# Patient Record
Sex: Female | Born: 1935 | Race: White | Hispanic: No | Smoking: Never smoker
Health system: Southern US, Community
[De-identification: ages and names within clinical notes are randomized; demographics above are authoritative.]

## PROBLEM LIST (undated history)

## (undated) DIAGNOSIS — I10 Essential (primary) hypertension: Secondary | ICD-10-CM

## (undated) DIAGNOSIS — K219 Gastro-esophageal reflux disease without esophagitis: Secondary | ICD-10-CM

## (undated) DIAGNOSIS — N289 Disorder of kidney and ureter, unspecified: Secondary | ICD-10-CM

## (undated) DIAGNOSIS — F0390 Unspecified dementia without behavioral disturbance: Secondary | ICD-10-CM

## (undated) HISTORY — PX: TONSILLECTOMY: SUR1361

---

## 2015-10-26 ENCOUNTER — Ambulatory Visit
Admission: EM | Admit: 2015-10-26 | Discharge: 2015-10-26 | Disposition: A | Discharge status: Home / Self Care | Payer: Medicare Other | Attending: Family Medicine | Admitting: Family Medicine

## 2015-10-26 ENCOUNTER — Encounter: Payer: Self-pay | Admitting: Emergency Medicine

## 2015-10-26 DIAGNOSIS — N390 Urinary tract infection, site not specified: Secondary | ICD-10-CM

## 2015-10-26 DIAGNOSIS — R300 Dysuria: Secondary | ICD-10-CM | POA: Diagnosis present

## 2015-10-26 HISTORY — DX: Disorder of kidney and ureter, unspecified: N28.9

## 2015-10-26 LAB — URINALYSIS COMPLETE WITH MICROSCOPIC (ARMC ONLY)
Bilirubin Urine: NEGATIVE
GLUCOSE, UA: NEGATIVE mg/dL
Ketones, ur: 15 mg/dL — AB
Nitrite: POSITIVE — AB
PROTEIN: 30 mg/dL — AB
Specific Gravity, Urine: 1.02 (ref 1.005–1.030)
pH: 5.5 (ref 5.0–8.0)

## 2015-10-26 MED ORDER — NITROFURANTOIN MONOHYD MACRO 100 MG PO CAPS: 100.0000 mg | ORAL_CAPSULE | Freq: Two times a day (BID) | ORAL | 0 refills | Status: DC

## 2015-10-26 NOTE — Discharge Instructions (Signed)
Stay hydrated, rest, seek medical attention if symptoms persist or worsen as discussed.

## 2015-10-26 NOTE — ED Provider Notes (Signed)
[facility]    CSN: [id number] Arrival date & time: 10/26/15  1352  First Provider Contact:  None       History   Chief Complaint Chief Complaint  Patient presents with  . Dysuria    HPI Dana Curtis is a 82 y.o. female.   HPI: Patient has had dysuria for the last 2 days. She denies hematuria, fever, chills, severe abdominal pain. Daughter is with her today. Patient does have a history of UTIs in the past. She has had no vomiting or flank pain. Patient and daughter deny any history of kidney function problems. Daughter denies any mental status changes.  Past Medical History:  Diagnosis Date  . Renal disorder     There are no active problems to display for this patient.   Past Surgical History:  Procedure Laterality Date  . TONSILLECTOMY      OB History    No data available       Home Medications    Prior to Admission medications   Not on File    Family History History reviewed. No pertinent family history.  Social History Social History  Substance Use Topics  . Smoking status: Never Smoker  . Smokeless tobacco: Never Used  . Alcohol use No     Allergies   Review of patient's allergies indicates no known allergies.   Review of Systems Review of Systems: Negative except mentioned above.   Physical Exam Triage Vital Signs ED Triage Vitals  Enc Vitals Group     BP 10/26/15 1516 (!) 143/60     Pulse Rate 10/26/15 1516 73     Resp 10/26/15 1516 17     Temp 10/26/15 1516 97.5 F (36.4 C)     Temp Source 10/26/15 1516 Oral     SpO2 10/26/15 1516 100 %     Weight 10/26/15 1518 135 lb (61.2 kg)     Height 10/26/15 1518 5' (1.524 m)     Head Circumference --      Peak Flow --      Pain Score 10/26/15 1520 4     Pain Loc --      Pain Edu? --      Excl. in GC? --    No data found.   Updated Vital Signs BP (!) 143/60 (BP Location: Left Arm)   Pulse 73   Temp 97.5 F (36.4 C) (Oral)   Resp 17   Ht 5' (1.524 m)   Wt 135 lb  (61.2 kg)   SpO2 100%   BMI 26.37 kg/m      Physical Exam:  GENERAL: NAD RESP: CTA B CARD: RRR ABD: mild suprapubic tenderness, no rebound or guarding, no flank tenderness appreciated   UC Treatments / Results  Labs (all labs ordered are listed, but only abnormal results are displayed) Labs Reviewed  URINE CULTURE  URINALYSIS COMPLETEWITH MICROSCOPIC ([facility] ONLY)    EKG  EKG Interpretation None       Radiology No results found.  Procedures Procedures (including critical care time)  Medications Ordered in UC Medications - No data to display   Initial Impression / Assessment and Plan / UC Course  I have reviewed the triage vital signs and the nursing notes.  Pertinent labs & imaging results that were available during my care of the patient were reviewed by me and considered in my medical decision making (see chart for details).  Clinical Course   A/P: UTI - will treat patient with Macrobid,  will send urine for culture, rest, hydration, seek medical attention if symptoms persist or worsen as discussed. I've asked the daughter to monitor her mother closely while we await the urine culture results if any worsening symptoms to seek medical attention immediately.  Final Clinical Impressions(s) / UC Diagnoses   Final diagnoses:  None    New Prescriptions New Prescriptions   No medications on file     Jolene Provost, MD 10/26/15 1542

## 2015-10-26 NOTE — ED Triage Notes (Signed)
Patient c/o burning when urinating for the past 2 days.  

## 2015-10-29 LAB — URINE CULTURE

## 2016-07-28 ENCOUNTER — Emergency Department: Payer: Medicare Other

## 2016-07-28 ENCOUNTER — Encounter: Payer: Self-pay | Admitting: Emergency Medicine

## 2016-07-28 ENCOUNTER — Emergency Department
Admission: EM | Admit: 2016-07-28 | Discharge: 2016-07-28 | Disposition: A | Discharge status: Skilled Nursing Facility | Payer: Medicare Other | Attending: Emergency Medicine | Admitting: Emergency Medicine

## 2016-07-28 DIAGNOSIS — Z79899 Other long term (current) drug therapy: Secondary | ICD-10-CM | POA: Insufficient documentation

## 2016-07-28 DIAGNOSIS — I10 Essential (primary) hypertension: Secondary | ICD-10-CM | POA: Diagnosis not present

## 2016-07-28 DIAGNOSIS — R112 Nausea with vomiting, unspecified: Secondary | ICD-10-CM

## 2016-07-28 DIAGNOSIS — N390 Urinary tract infection, site not specified: Secondary | ICD-10-CM | POA: Diagnosis not present

## 2016-07-28 DIAGNOSIS — F0390 Unspecified dementia without behavioral disturbance: Secondary | ICD-10-CM | POA: Diagnosis not present

## 2016-07-28 DIAGNOSIS — R1084 Generalized abdominal pain: Secondary | ICD-10-CM | POA: Diagnosis present

## 2016-07-28 DIAGNOSIS — Z7982 Long term (current) use of aspirin: Secondary | ICD-10-CM | POA: Insufficient documentation

## 2016-07-28 HISTORY — DX: Unspecified dementia, unspecified severity, without behavioral disturbance, psychotic disturbance, mood disturbance, and anxiety: F03.90

## 2016-07-28 HISTORY — DX: Gastro-esophageal reflux disease without esophagitis: K21.9

## 2016-07-28 HISTORY — DX: Essential (primary) hypertension: I10

## 2016-07-28 LAB — URINALYSIS, COMPLETE (UACMP) WITH MICROSCOPIC
BILIRUBIN URINE: NEGATIVE
Glucose, UA: NEGATIVE mg/dL
KETONES UR: 5 mg/dL — AB
NITRITE: POSITIVE — AB
PH: 5 (ref 5.0–8.0)
Protein, ur: 100 mg/dL — AB
SPECIFIC GRAVITY, URINE: 1.023 (ref 1.005–1.030)

## 2016-07-28 LAB — COMPREHENSIVE METABOLIC PANEL
ALT: 18 U/L (ref 14–54)
ANION GAP: 11 (ref 5–15)
AST: 36 U/L (ref 15–41)
Albumin: 3.8 g/dL (ref 3.5–5.0)
Alkaline Phosphatase: 69 U/L (ref 38–126)
BILIRUBIN TOTAL: 0.5 mg/dL (ref 0.3–1.2)
BUN: 20 mg/dL (ref 6–20)
CHLORIDE: 101 mmol/L (ref 101–111)
CO2: 23 mmol/L (ref 22–32)
Calcium: 8.9 mg/dL (ref 8.9–10.3)
Creatinine, Ser: 0.62 mg/dL (ref 0.44–1.00)
Glucose, Bld: 171 mg/dL — ABNORMAL HIGH (ref 65–99)
POTASSIUM: 3.9 mmol/L (ref 3.5–5.1)
Sodium: 135 mmol/L (ref 135–145)
TOTAL PROTEIN: 7.4 g/dL (ref 6.5–8.1)

## 2016-07-28 LAB — CBC
HEMATOCRIT: 40.2 % (ref 35.0–47.0)
Hemoglobin: 13.3 g/dL (ref 12.0–16.0)
MCH: 29.2 pg (ref 26.0–34.0)
MCHC: 33.1 g/dL (ref 32.0–36.0)
MCV: 88.4 fL (ref 80.0–100.0)
Platelets: 285 10*3/uL (ref 150–440)
RBC: 4.55 MIL/uL (ref 3.80–5.20)
RDW: 14.7 % — AB (ref 11.5–14.5)
WBC: 17.7 10*3/uL — AB (ref 3.6–11.0)

## 2016-07-28 LAB — TROPONIN I

## 2016-07-28 LAB — LIPASE, BLOOD: LIPASE: 27 U/L (ref 11–51)

## 2016-07-28 MED ORDER — CEPHALEXIN 500 MG PO CAPS: 500.0000 mg | ORAL_CAPSULE | Freq: Two times a day (BID) | ORAL | 0 refills | Status: DC

## 2016-07-28 MED ORDER — IOPAMIDOL (ISOVUE-300) INJECTION 61%
30.0000 mL | Freq: Once | INTRAVENOUS | Status: AC
  Administered 2016-07-28: 30 mL via ORAL

## 2016-07-28 MED ORDER — ONDANSETRON HCL 4 MG/2ML IJ SOLN
4.0000 mg | INTRAMUSCULAR | Status: AC
  Administered 2016-07-28: 4 mg via INTRAVENOUS
  Filled 2016-07-28: qty 2

## 2016-07-28 MED ORDER — IOPAMIDOL (ISOVUE-300) INJECTION 61%
100.0000 mL | Freq: Once | INTRAVENOUS | Status: AC | PRN
  Administered 2016-07-28: 100 mL via INTRAVENOUS

## 2016-07-28 MED ORDER — SODIUM CHLORIDE 0.9 % IV BOLUS (SEPSIS)
500.0000 mL | INTRAVENOUS | Status: AC
  Administered 2016-07-28: 500 mL via INTRAVENOUS

## 2016-07-28 MED ORDER — DEXTROSE 5 % IV SOLN
INTRAVENOUS | Status: AC
  Filled 2016-07-28: qty 10

## 2016-07-28 MED ORDER — DEXTROSE 5 % IV SOLN
1.0000 g | INTRAVENOUS | Status: AC
  Administered 2016-07-28: 1 g via INTRAVENOUS

## 2016-07-28 NOTE — ED Notes (Signed)
ED Provider at bedside. 

## 2016-07-28 NOTE — ED Notes (Signed)
Iv dc'ed from infiltration.

## 2016-07-28 NOTE — ED Notes (Signed)
Pt helped to bedpan and cleaned by this RN

## 2016-07-28 NOTE — ED Provider Notes (Signed)
[facility] Emergency Department Provider Note  ____________________________________________   First MD Initiated Contact with Patient 07/28/16 1545     (approximate)  I have reviewed the triage vital signs and the nursing notes.   HISTORY  Chief Complaint Emesis  Level 5 caveat:  history/ROS limited by chronic dementia  HPI Dana Curtis is a 84 y.o. female with a history of dementia who presents by EMS from her nursing facility for evaluation of abdominal pain with vomiting.  Unfortunately little history is available but reportedly she has vomited multiple times today.  She is in no acute distress at this time and admits to me "my memory is not very good".  She states that nothing is hurting her right at this minute and that she does not feel nauseated.  She is not sure if she was ill earlier.  Reportedly the symptoms were severe and nothing made them better or worse, but this cannot be verified.   Past Medical History:  Diagnosis Date  . Chronic GERD   . Dementia   . Hypertension   . Renal disorder     There are no active problems to display for this patient.   Past Surgical History:  Procedure Laterality Date  . TONSILLECTOMY      Prior to Admission medications   Medication Sig Start Date End Date Taking? Authorizing Provider  amLODipine (NORVASC) 5 MG tablet Take 5 mg by mouth daily.    [provider]  aspirin EC 81 MG tablet Take 81 mg by mouth daily.    [provider]  Calcium 600-400 MG-UNIT CHEW Chew by mouth daily.    [provider]  cephALEXin (KEFLEX) 500 MG capsule Take 1 capsule (500 mg total) by mouth 2 (two) times daily. 07/28/16   Loleta Rose, MD  citalopram (CELEXA) 10 MG tablet Take 10 mg by mouth daily.    [provider]  Cranberry 450 MG TABS Take by mouth daily.    [provider]  donepezil (ARICEPT) 10 MG tablet Take 10 mg by mouth at bedtime.    [provider]    losartan (COZAAR) 100 MG tablet Take 100 mg by mouth daily.    [provider]  Melatonin 3 MG TABS Take by mouth at bedtime.    [provider]  Multiple Vitamin (MULTIVITAMIN) tablet Take 1 tablet by mouth daily.    [provider]  nitrofurantoin, macrocrystal-monohydrate, (MACROBID) 100 MG capsule Take 1 capsule (100 mg total) by mouth 2 (two) times daily. 10/26/15   Jolene Provost, MD  oxybutynin (DITROPAN-XL) 5 MG 24 hr tablet Take 2.5 mg by mouth at bedtime.    [provider]  ranitidine (ZANTAC) 75 MG tablet Take 75 mg by mouth daily.    [provider]    Allergies Patient has no known allergies.  History reviewed. No pertinent family history.  Social History Social History  Substance Use Topics  . Smoking status: Never Smoker  . Smokeless tobacco: Never Used  . Alcohol use No    Review of Systems Level 5 caveat:  history/ROS limited by chronic dementia ____________________________________________   PHYSICAL EXAM:  VITAL SIGNS: ED Triage Vitals  Enc Vitals Group     BP 07/28/16 1517 (!) 158/83     Pulse Rate 07/28/16 1517 (!) 10     Resp 07/28/16 1517 20     Temp 07/28/16 1517 98.7 F (37.1 C)     Temp src --  SpO2 07/28/16 1517 96 %     Weight 07/28/16 1518 68 kg (150 lb)     Height 07/28/16 1518 1.6 m (5\' 3" )     Head Circumference --      Peak Flow --      Pain Score --      Pain Loc --      Pain Edu? --      Excl. in GC? --     Constitutional: Alert And oriented to person.  No acute distress Eyes: Conjunctivae are normal.  Head: Atraumatic. Cardiovascular: Normal rate, regular rhythm. Good peripheral circulation. Grossly normal heart sounds. Respiratory: Normal respiratory effort.  No retractions. Lungs CTAB. Gastrointestinal: Some distention.  Soft but with moderate generalized tenderness throughout that resulted in involuntary guarding and the patient crying out. Musculoskeletal: No lower extremity  tenderness nor edema. No gross deformities of extremities. Neurologic:  Normal speech and language. No gross focal neurologic deficits are appreciated.  Skin:  Skin is warm, dry and intact. No rash noted.   ____________________________________________   LABS (all labs ordered are listed, but only abnormal results are displayed)  Labs Reviewed  COMPREHENSIVE METABOLIC PANEL - Abnormal; Notable for the following:       Result Value   Glucose, Bld 171 (*)    All other components within normal limits  CBC - Abnormal; Notable for the following:    WBC 17.7 (*)    RDW 14.7 (*)    All other components within normal limits  URINALYSIS, COMPLETE (UACMP) WITH MICROSCOPIC - Abnormal; Notable for the following:    Color, Urine YELLOW (*)    APPearance HAZY (*)    Hgb urine dipstick SMALL (*)    Ketones, ur 5 (*)    Protein, ur 100 (*)    Nitrite POSITIVE (*)    Leukocytes, UA TRACE (*)    Bacteria, UA RARE (*)    Squamous Epithelial / LPF 0-5 (*)    All other components within normal limits  URINE CULTURE  LIPASE, BLOOD  TROPONIN I   ____________________________________________  EKG  ED ECG REPORT I, Mark Benecke, the attending physician, personally viewed and interpreted this ECG.  Date: 07/28/2016 EKG Time: 15:19 Rate: 102 Rhythm: borderline sinus tachycardia QRS Axis: normal Intervals:  ST/T Wave abnormalities: Non-specific ST segment / T-wave changes, but no evidence of acute ischemia. Narrative Interpretation: unremarkable   ____________________________________________  RADIOLOGY   Ct Abdomen Pelvis W Contrast  Result Date: 07/28/2016 CLINICAL DATA:  Generalized abdominal pain and distention; concern for obstruction. EXAM: CT ABDOMEN AND PELVIS WITH CONTRAST TECHNIQUE: Multidetector CT imaging of the abdomen and pelvis was performed using the standard protocol following bolus administration of intravenous contrast. CONTRAST:  ISOVUE-300 IOPAMIDOL (ISOVUE-300)  INJECTION 61% COMPARISON:  None. FINDINGS: Lower chest: Cardiomegaly without pericardial effusion. Moderate large hiatal hernia partially imaged at the lung base. There is atelectatic change at each lung base without pneumonic consolidation, effusion or pneumothorax. 8 mm left lower lobe pulmonary nodule image 11/4. Hepatobiliary: 17 mm circumscribed hypodensity in the left hepatic lobe may represent a cyst or hemangioma. No biliary dilatation. No solid enhancing mass lesions. Normal gallbladder without stones. Pancreas: Normal Spleen: No splenomegaly or mass. Adrenals/Urinary Tract: Mild bilateral adrenal gland thickening without nodularity. Small subcentimeter hypodensities too small further characterize within both kidneys statistically consistent with cysts. No obstructive uropathy. Normal appearing bladder. Stomach/Bowel: Hiatal hernia as described before. Normal bowel rotation without obstruction or inflammation. Normal appendix. Moderate stool burden within large bowel  without obstruction or acute inflammation. Scattered diverticulosis without acute diverticulitis. Vascular/Lymphatic: Aortoiliac and branch vessel atherosclerosis without aneurysm or dissection. Patent portal and splenic veins. Reproductive: Fluid-filled endometrial cavity measuring up to 11 mm in thickness possibly representing sequela of cervical stenosis. No uterine mass. There are small rounded hypodensities measuring up to 1 cm that may represent small cysts associated with the right ovary. These findings could be further assessed with nonemergent ultrasound. No left adnexal abnormality Other: No abdominal wall hernia or abnormality. No abdominopelvic ascites. Musculoskeletal: Thoracolumbar spondylosis with lumbar dextroscoliosis. No acute nor suspicious osseous abnormalities. Osteoarthritis of both hip joints with joint space narrowing and subchondral cystic change noted of the left acetabulum. IMPRESSION: 1. Large hiatal hernia. No bowel  obstruction noted. Scattered colonic diverticulosis is seen. 2. Fluid-filled endometrial cavity may reflect cervical stenosis. Small 1 cm or less hypodensities associated with the right ovary likely represent small cysts. These findings could be correlated with ultrasound on a nonemergent basis. 3. Aortoiliac and branch vessel atherosclerosis without aneurysm or dissection. 4. Hypodensities associated with the liver and spleen which may represent small cysts. The renal hypodensities are too small to further characterize. 5. Mild hyperplastic appearance the adrenal glands bilaterally. Electronically Signed   By: Tollie Eth M.D.   On: 07/28/2016 18:07    ____________________________________________   PROCEDURES  Critical Care performed: No   Procedure(s) performed:   Procedures   ____________________________________________   INITIAL IMPRESSION / ASSESSMENT AND PLAN / ED COURSE  Pertinent labs & imaging results that were available during my care of the patient were reviewed by me and considered in my medical decision making (see chart for details).  I am concerned about the possibility of obstruction although given her age and comorbidities, other diagnoses such as mesenteric ischemia, intra-abdominal infection, etc., speak considered.  Her creatinine is normal today and I will provide a small fluid bolus and obtain a CT scan with IV and oral contrast (hoping that she will be able to tolerate some of the oral contrast to identify if there is a transition point).  I also provided Zofran 4 mg IV to help with her nausea.  She is not in need of pain medicine at this time as she is in no pain at rest, only with palpation of her abdomen.   Clinical Course as of Jul 28 2009  Wed Jul 28, 2016  1844 No acute findings on CT, patient is hungry, will try PO challenge CT ABDOMEN PELVIS W CONTRAST [CF]  1917 Patient has not had any vomiting after eating and feels much better.  Her urine is positive  for nitrites and WBCs and I have sent a urine culture and am treating with ceftriaxone 1 g IV.  If she continues to feel fine with no additional vomiting I plan to discharge her back to her facility with antibiotics for UTI.  [CF]  2008 Patient is feeling much better and says she is ready to go to bed.  I will discharge her back to her nursing facility for outpatient follow-up.  [CF]    Clinical Course User Index [CF] Loleta Rose, MD    ____________________________________________  FINAL CLINICAL IMPRESSION(S) / ED DIAGNOSES  Final diagnoses:  Generalized abdominal pain  Non-intractable vomiting with nausea, unspecified vomiting type  Urinary tract infection without hematuria, site unspecified     MEDICATIONS GIVEN DURING THIS VISIT:  Medications  ondansetron (ZOFRAN) injection 4 mg (4 mg Intravenous Given 07/28/16 1601)  sodium chloride 0.9 % bolus 500 mL (  0 mLs Intravenous Stopped 07/28/16 1653)  iopamidol (ISOVUE-300) 61 % injection 30 mL (30 mLs Oral Contrast Given 07/28/16 1621)  iopamidol (ISOVUE-300) 61 % injection 100 mL (100 mLs Intravenous Contrast Given 07/28/16 1736)  cefTRIAXone (ROCEPHIN) 1 g in dextrose 5 % 50 mL IVPB (0 g Intravenous Stopped 07/28/16 1951)     NEW OUTPATIENT MEDICATIONS STARTED DURING THIS VISIT:  New Prescriptions   CEPHALEXIN (KEFLEX) 500 MG CAPSULE    Take 1 capsule (500 mg total) by mouth 2 (two) times daily.    Modified Medications   No medications on file    Discontinued Medications   No medications on file     Note:  This document was prepared using Dragon voice recognition software and may include unintentional dictation errors.    Loleta RoseForbach, Jarick Harkins, MD 07/28/16 2011

## 2016-07-28 NOTE — ED Notes (Signed)
[facility] Said to send pt back by EMS. Med Necessity completed and secretary called for next available transport. Pt informed and going to back to sleep in tx room. No distress noted. Pt ready for transport pending arrival.

## 2016-07-28 NOTE — ED Notes (Signed)
Pt alert.  Pt has abd pain with vomiting today. No diarrhea.  Iv in place.  Labs sent. nsr on monitor.  siderails up x 2.

## 2016-07-28 NOTE — ED Notes (Signed)
Report off to allison rn  

## 2016-07-28 NOTE — ED Triage Notes (Signed)
Pt brought in from [facility].  Pt has vomiting for 1 day.  Hx dementia.  Pt alert  Iv in place

## 2016-07-28 NOTE — ED Notes (Signed)
Patient transported to CT 

## 2016-07-28 NOTE — ED Notes (Signed)
Report from Amy, RN at this time.  

## 2016-07-28 NOTE — Discharge Instructions (Signed)
You have been seen in the Emergency Department (ED) for abdominal pain and vomiting.  Your evaluation did not identify a clear cause of your symptoms but was generally reassuring.  You do have a urinary tract infection for which we prescribed a course of antibiotics.  Please follow up as instructed above regarding today?s emergent visit and the symptoms that are bothering you.  Return to the ED if your abdominal pain worsens or fails to improve, you develop bloody vomiting, bloody diarrhea, you are unable to tolerate fluids due to vomiting, fever greater than 101, or other symptoms that concern you.

## 2016-07-28 NOTE — ED Notes (Signed)
The patient was given a sandwich tray to eat and a cup of ice water. She was sat upright and thanked me for it several times.

## 2016-07-28 NOTE — ED Notes (Signed)
Pt helped onto bedpan and cleaned by this RN

## 2016-07-28 NOTE — ED Notes (Signed)
Pt. Facility staff Verbalizes understanding of d/c instructions, prescriptions, and follow-up. VS stable and pain controlled per pt.  Pt. In NAD at time of d/c and denies further concerns regarding this visit. Pt. Stable at the time of departure from the unit, departing unit by the safest and most appropriate manner per that pt condition and limitations. Pt facility staff advised to return to the ED at any time for emergent concerns, or for new/worsening symptoms.

## 2016-07-31 LAB — URINE CULTURE
Culture: >=100000 — AB
Special Requests: NORMAL

## 2017-01-28 ENCOUNTER — Encounter: Payer: Self-pay | Admitting: Emergency Medicine

## 2017-01-28 ENCOUNTER — Emergency Department: Payer: Medicare Other

## 2017-01-28 ENCOUNTER — Emergency Department
Admission: EM | Admit: 2017-01-28 | Discharge: 2017-01-28 | Disposition: A | Discharge status: Home / Self Care | Payer: Medicare Other | Attending: Emergency Medicine | Admitting: Emergency Medicine

## 2017-01-28 ENCOUNTER — Other Ambulatory Visit: Payer: Self-pay

## 2017-01-28 DIAGNOSIS — M79601 Pain in right arm: Secondary | ICD-10-CM | POA: Diagnosis not present

## 2017-01-28 DIAGNOSIS — Y999 Unspecified external cause status: Secondary | ICD-10-CM | POA: Insufficient documentation

## 2017-01-28 DIAGNOSIS — S0990XA Unspecified injury of head, initial encounter: Secondary | ICD-10-CM | POA: Diagnosis not present

## 2017-01-28 DIAGNOSIS — Y939 Activity, unspecified: Secondary | ICD-10-CM | POA: Insufficient documentation

## 2017-01-28 DIAGNOSIS — F0390 Unspecified dementia without behavioral disturbance: Secondary | ICD-10-CM | POA: Insufficient documentation

## 2017-01-28 DIAGNOSIS — Y92122 Bedroom in nursing home as the place of occurrence of the external cause: Secondary | ICD-10-CM | POA: Diagnosis not present

## 2017-01-28 DIAGNOSIS — M25551 Pain in right hip: Secondary | ICD-10-CM | POA: Insufficient documentation

## 2017-01-28 DIAGNOSIS — M25561 Pain in right knee: Secondary | ICD-10-CM | POA: Insufficient documentation

## 2017-01-28 DIAGNOSIS — W06XXXA Fall from bed, initial encounter: Secondary | ICD-10-CM | POA: Insufficient documentation

## 2017-01-28 DIAGNOSIS — I10 Essential (primary) hypertension: Secondary | ICD-10-CM | POA: Diagnosis not present

## 2017-01-28 DIAGNOSIS — W19XXXA Unspecified fall, initial encounter: Secondary | ICD-10-CM

## 2017-01-28 IMAGING — CR DG FEMUR 2+V*R*
4 series · 4 of 4 positions shown · non-contrast
Comparison: None.

CLINICAL DATA: Pain following fall

EXAM:
RIGHT FEMUR 2 VIEWS

[femur ap (1 of 2)]
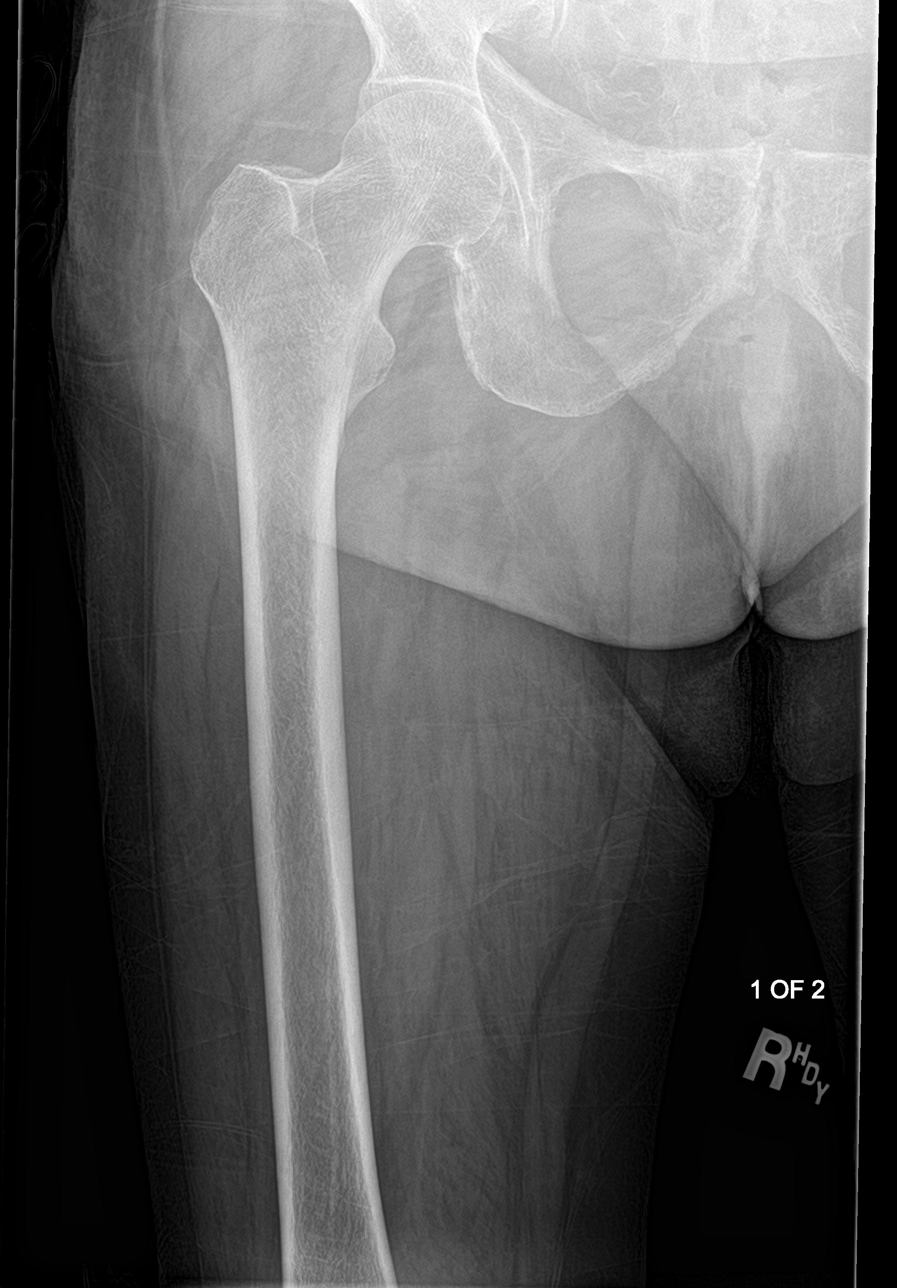

[femur ap (2 of 2)]
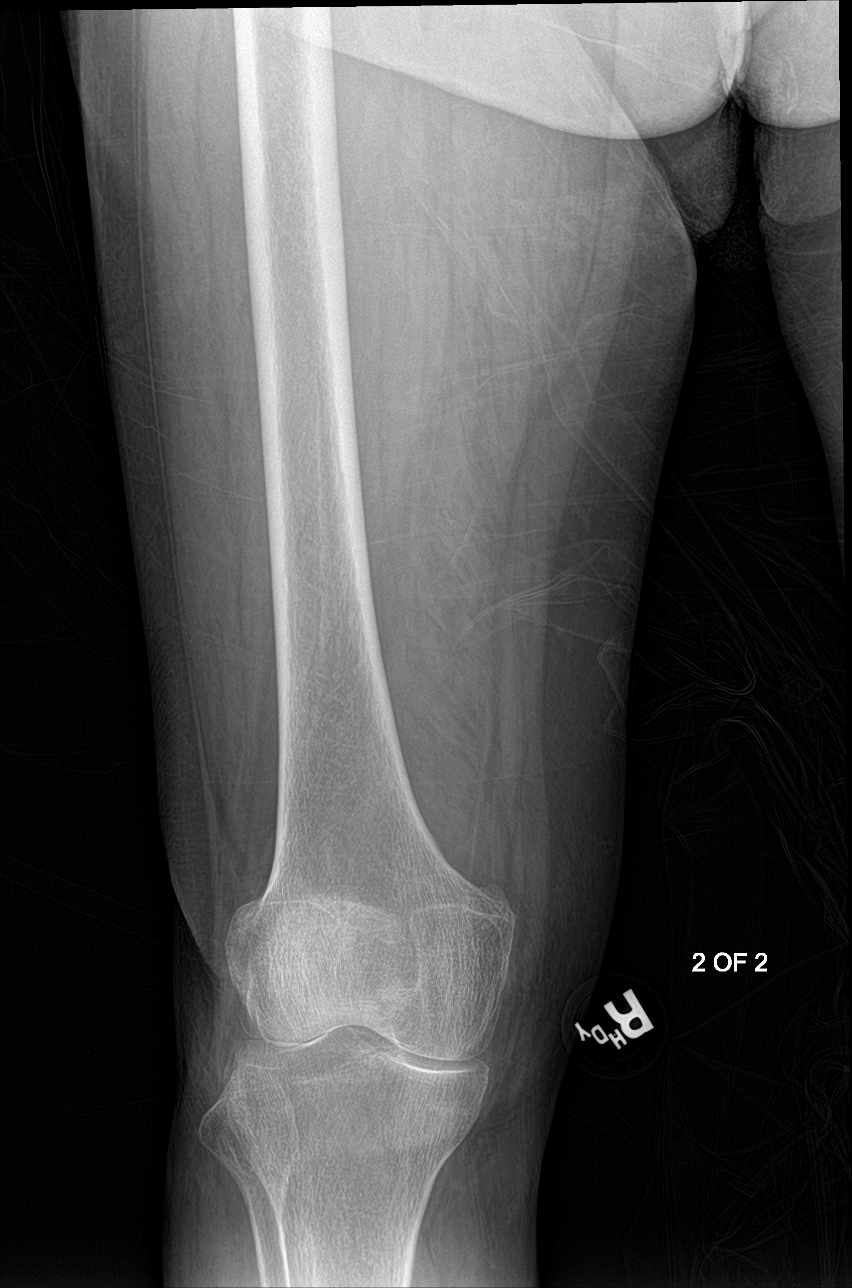

[femur lat (1 of 2)]
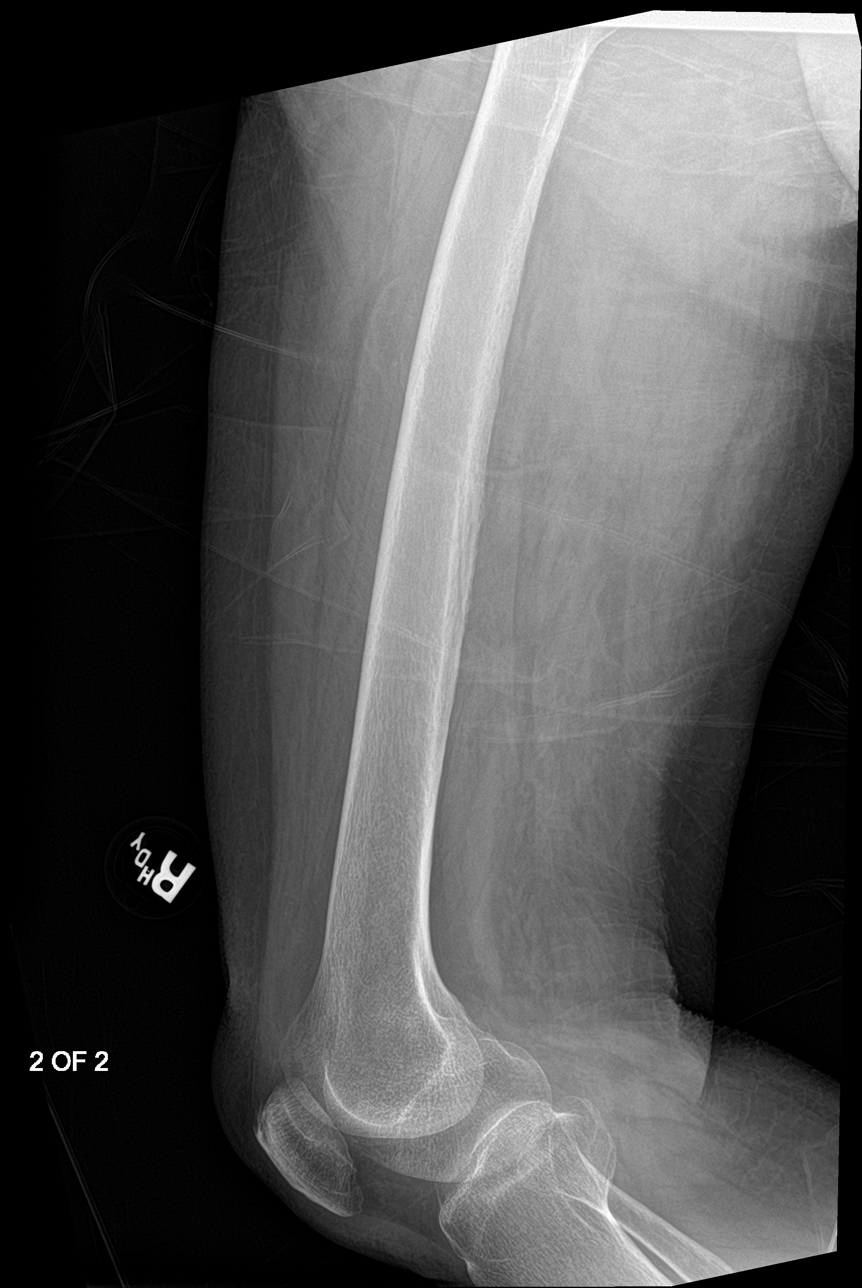

[femur lat (2 of 2)]
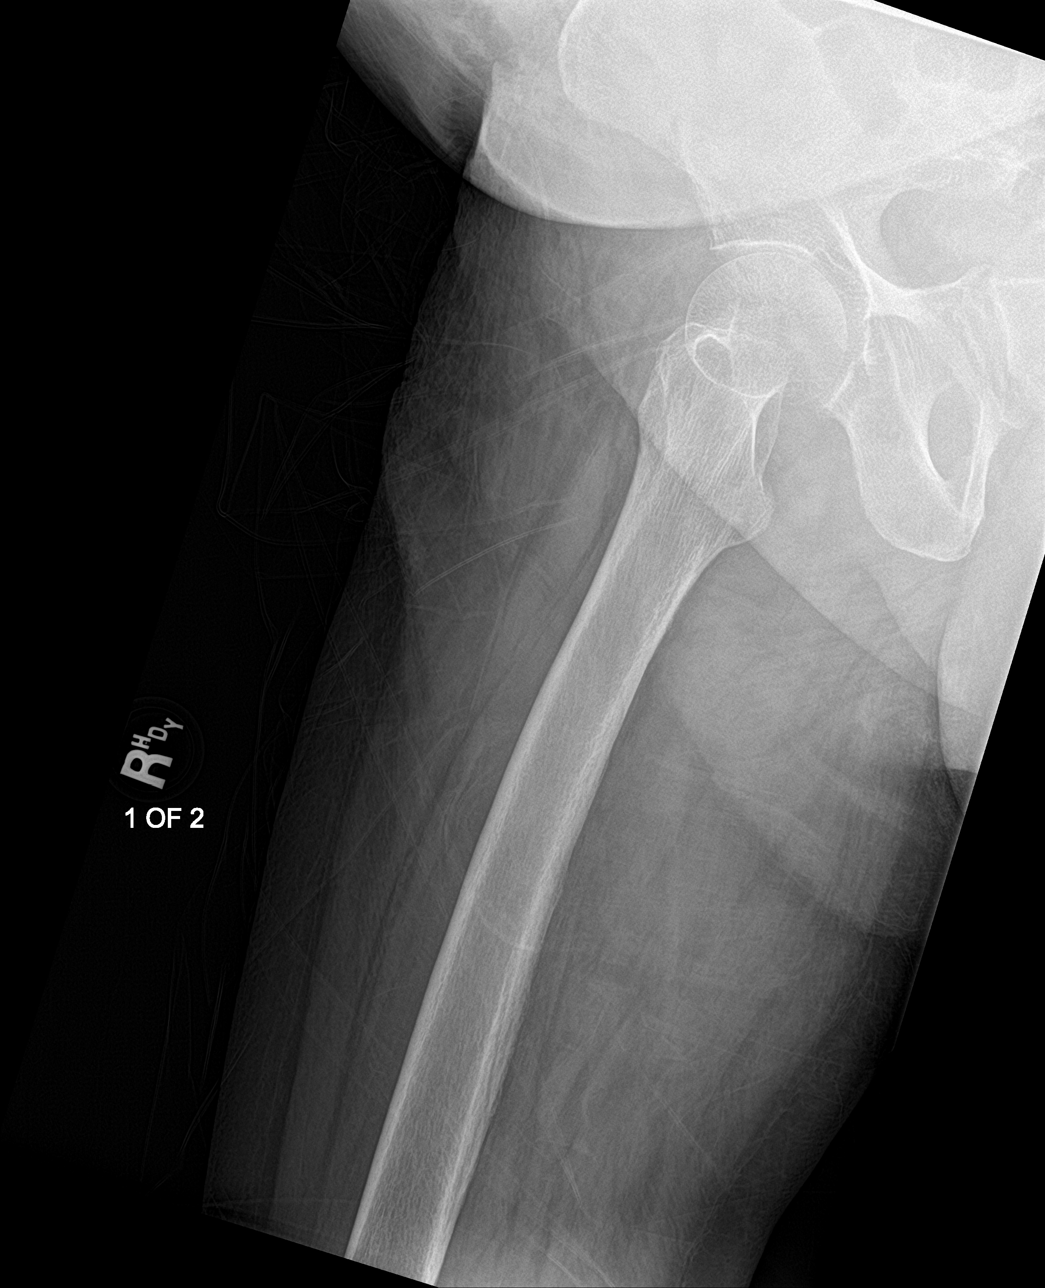

[4 of 4 positions shown; findings below may reference images not displayed]

FINDINGS: Frontal and lateral views were obtained. There is no evident
fracture or dislocation. Bones are osteoporotic. There is mild
narrowing of the right hip joint. No knee joint effusion.
IMPRESSION: Bones osteoporotic.  No fracture or dislocation.

## 2017-01-28 NOTE — ED Provider Notes (Signed)
[facility] Emergency Department Provider Note       Time seen: ----------------------------------------- 10:25 AM on 01/28/2017 ----------------------------------------- Level V caveat: History/ROS limited by dementia  I have reviewed the triage vital signs and the nursing notes.  HISTORY   Chief Complaint Fall; Shoulder Pain; and Knee Pain   HPI Fritzi MandesJoyce Zartman is a 83 y.o. female with a history of dementia, hypertension and renal disorder who presents to the ED for a fall that was unwitnessed.  [facility] out of bed.  It was reported she rolled out of bed and hit her head on the wall.  She is complaining of right hip and right arm pain as well as right knee pain.  She is DNR.  Is sent to the ER for further evaluation.  Review of systems and history is limited by dementia.  Past Medical History:  Diagnosis Date  . Chronic GERD   . Dementia   . Hypertension   . Renal disorder     There are no active problems to display for this patient.   Past Surgical History:  Procedure Laterality Date  . TONSILLECTOMY      Allergies Patient has no known allergies.  Social History Social History   Tobacco Use  . Smoking status: Never Smoker  . Smokeless tobacco: Never Used  Substance Use Topics  . Alcohol use: No  . Drug use: No    Review of Systems Constitutional: Negative for fever. Cardiovascular: Negative for chest pain. Respiratory: Negative for shortness of breath. Gastrointestinal: Negative for abdominal pain, vomiting and diarrhea. Musculoskeletal: Positive for right hip, right knee, right elbow pain Skin: Negative for rash. Neurological: Negative for headaches  All systems negative/normal/unremarkable except as stated in the HPI  ____________________________________________   PHYSICAL EXAM:  VITAL SIGNS: ED Triage Vitals  Enc Vitals Group     BP      Pulse      Resp      Temp       Temp src      SpO2      Weight      Height      Head Circumference      Peak Flow      Pain Score      Pain Loc      Pain Edu?      Excl. in GC?     Constitutional: Alert but disoriented. Well appearing and in no distress. Eyes: Conjunctivae are normal. Normal extraocular movements. ENT   Head: Normocephalic and atraumatic.   Nose: No congestion/rhinnorhea.   Mouth/Throat: Mucous membranes are moist.   Neck: No stridor. Cardiovascular: Normal rate, regular rhythm. No murmurs, rubs, or gallops. Respiratory: Normal respiratory effort without tachypnea nor retractions. Breath sounds are clear and equal bilaterally. No wheezes/rales/rhonchi. Gastrointestinal: Soft and nontender. Normal bowel sounds Musculoskeletal: Pain with range of motion of the right arm, right leg including the knee and hip Neurologic:  Normal speech and language. No gross focal neurologic deficits are appreciated.  Skin:  Skin is warm, dry and intact. No rash noted. Psychiatric: Mood and affect are normal. Speech and behavior are normal.  ___________________________________________  ED COURSE:  Pertinent labs & imaging results that were available during my care of the patient were reviewed by me and considered in my medical decision making (see chart for details). Patient presents for a fall, we will assess with imaging as indicated.   Procedures ____________________________________________   RADIOLOGY Images  were viewed by me  CT head, right humerus x-rays, right femur x-rays, pelvis x-ray  IMPRESSION: Diffuse atrophy with slight supratentorial small vessel disease. No mass, hemorrhage, or extra-axial fluid collection. No acute infarct evident.  There are foci of arterial vascular calcification. There is ethmoid sinus disease as well as right mastoid disease. IMPRESSION: Symmetric narrowing of both hip joints. Arthropathy in the pubic symphysis. No acute fracture or  dislocation. IMPRESSION: No fracture or dislocation. Bones osteoporotic. Mild degenerative change in the right shoulder.  IMPRESSION: Bones osteoporotic. No fracture or dislocation. ____________________________________________  DIFFERENTIAL DIAGNOSIS   Closed head injury, subdural hematoma, fracture, dislocation, contusion  FINAL ASSESSMENT AND PLAN  Fall, minor head injury   Plan: Patient had presented for mechanical fall.  Patient's imaging here was reassuring.  We were able to ambulate her with out any acute complaints.  She is stable to return to the nursing home.   Emily FilbertWilliams, Liat Mayol E, MD   Note: This note was generated in part or whole with voice recognition software. Voice recognition is usually quite accurate but there are transcription errors that can and very often do occur. I apologize for any typographical errors that were not detected and corrected.     Emily FilbertWilliams, Andru Genter E, MD 01/28/17 [phone]

## 2017-01-28 NOTE — ED Notes (Signed)
Attempted x3 to [facility] no answer each time called and no voicemail. Paperwork including DNR given to daughter Burnett HarryShelly.  Pt left with hearing aids in place bilaterally.

## 2017-01-28 NOTE — ED Notes (Signed)
Pt ambulated in the room with walker, assisted patient to the bathroom as well. Pt c/o pain, but states she was able to tolerate it.  [facility]. Pt uses a walker at home.

## 2017-01-28 NOTE — ED Triage Notes (Signed)
[facility] s/p fall out of bed.  It was reported the patient rolled out of bed and hit her head on the wall.  Pt c/o right hip pain and shoulder pain, also c/o knee pain on the right to light palpation.  Pt has DNR sheet with her.

## 2017-01-28 NOTE — ED Notes (Signed)
Pt transported to CT/XR 

## 2018-03-02 ENCOUNTER — Emergency Department
Admission: EM | Admit: 2018-03-02 | Discharge: 2018-03-02 | Disposition: A | Discharge status: Home / Self Care | Payer: Medicare Other | Attending: Emergency Medicine | Admitting: Emergency Medicine

## 2018-03-02 ENCOUNTER — Emergency Department: Payer: Medicare Other

## 2018-03-02 ENCOUNTER — Encounter: Payer: Self-pay | Admitting: Medical Oncology

## 2018-03-02 DIAGNOSIS — Y999 Unspecified external cause status: Secondary | ICD-10-CM | POA: Insufficient documentation

## 2018-03-02 DIAGNOSIS — Y939 Activity, unspecified: Secondary | ICD-10-CM | POA: Insufficient documentation

## 2018-03-02 DIAGNOSIS — F0390 Unspecified dementia without behavioral disturbance: Secondary | ICD-10-CM | POA: Diagnosis not present

## 2018-03-02 DIAGNOSIS — I10 Essential (primary) hypertension: Secondary | ICD-10-CM | POA: Insufficient documentation

## 2018-03-02 DIAGNOSIS — W06XXXA Fall from bed, initial encounter: Secondary | ICD-10-CM | POA: Insufficient documentation

## 2018-03-02 DIAGNOSIS — S46911A Strain of unspecified muscle, fascia and tendon at shoulder and upper arm level, right arm, initial encounter: Secondary | ICD-10-CM | POA: Diagnosis not present

## 2018-03-02 DIAGNOSIS — Y92002 Bathroom of unspecified non-institutional (private) residence single-family (private) house as the place of occurrence of the external cause: Secondary | ICD-10-CM | POA: Diagnosis not present

## 2018-03-02 DIAGNOSIS — M25511 Pain in right shoulder: Secondary | ICD-10-CM

## 2018-03-02 DIAGNOSIS — W19XXXA Unspecified fall, initial encounter: Secondary | ICD-10-CM

## 2018-03-02 DIAGNOSIS — M25551 Pain in right hip: Secondary | ICD-10-CM

## 2018-03-02 DIAGNOSIS — S4991XA Unspecified injury of right shoulder and upper arm, initial encounter: Secondary | ICD-10-CM | POA: Diagnosis present

## 2018-03-02 LAB — TROPONIN I: Troponin I: <0.03 ng/mL (ref <0.03)

## 2018-03-02 LAB — COMPREHENSIVE METABOLIC PANEL
ALK PHOS: 74 U/L (ref 38–126)
ALT: 14 U/L (ref 0–44)
AST: 18 U/L (ref 15–41)
Albumin: 3.8 g/dL (ref 3.5–5.0)
Anion gap: 11 (ref 5–15)
BUN: 19 mg/dL (ref 8–23)
CALCIUM: 9.4 mg/dL (ref 8.9–10.3)
CHLORIDE: 104 mmol/L (ref 98–111)
CO2: 24 mmol/L (ref 22–32)
CREATININE: 0.75 mg/dL (ref 0.44–1.00)
GFR calc non Af Amer: >60 mL/min (ref >60)
Glucose, Bld: 122 mg/dL — ABNORMAL HIGH (ref 70–99)
Potassium: 3.8 mmol/L (ref 3.5–5.1)
SODIUM: 139 mmol/L (ref 135–145)
Total Bilirubin: 0.5 mg/dL (ref 0.3–1.2)
Total Protein: 7.5 g/dL (ref 6.5–8.1)

## 2018-03-02 LAB — URINALYSIS, COMPLETE (UACMP) WITH MICROSCOPIC
BILIRUBIN URINE: NEGATIVE
GLUCOSE, UA: NEGATIVE mg/dL
HGB URINE DIPSTICK: NEGATIVE
Ketones, ur: NEGATIVE mg/dL
Nitrite: POSITIVE — AB
PH: 8 (ref 5.0–8.0)
PROTEIN: NEGATIVE mg/dL
SPECIFIC GRAVITY, URINE: 1.005 (ref 1.005–1.030)

## 2018-03-02 LAB — CBC WITH DIFFERENTIAL/PLATELET
ABS IMMATURE GRANULOCYTES: 0.05 10*3/uL (ref 0.00–0.07)
Basophils Absolute: 0.1 10*3/uL (ref 0.0–0.1)
Basophils Relative: 0 %
Eosinophils Absolute: 0.2 10*3/uL (ref 0.0–0.5)
Eosinophils Relative: 2 %
HCT: 39.7 % (ref 36.0–46.0)
HEMOGLOBIN: 12.9 g/dL (ref 12.0–15.0)
IMMATURE GRANULOCYTES: 0 %
LYMPHS PCT: 22 %
Lymphs Abs: 2.6 10*3/uL (ref 0.7–4.0)
MCH: 29.9 pg (ref 26.0–34.0)
MCHC: 32.5 g/dL (ref 30.0–36.0)
MCV: 91.9 fL (ref 80.0–100.0)
MONO ABS: 0.9 10*3/uL (ref 0.1–1.0)
MONOS PCT: 8 %
NEUTROS ABS: 8.1 10*3/uL — AB (ref 1.7–7.7)
NEUTROS PCT: 68 %
Platelets: 385 10*3/uL (ref 150–400)
RBC: 4.32 MIL/uL (ref 3.87–5.11)
RDW: 14.1 % (ref 11.5–15.5)
WBC: 12 10*3/uL — ABNORMAL HIGH (ref 4.0–10.5)
nRBC: 0 % (ref 0.0–0.2)

## 2018-03-02 NOTE — ED Notes (Signed)
Pt agitated and stating that she is wet - pt checked and dry - family at bedside

## 2018-03-02 NOTE — ED Provider Notes (Signed)
[facility] Emergency Department Provider Note       Time seen: ----------------------------------------- 10:00 AM on 03/02/2018 -----------------------------------------   I have reviewed the triage vital signs and the nursing notes.  HISTORY   Chief Complaint Fall and Hip Pain    HPI Dana Curtis is a 85 y.o. female with a history of GERD, dementia, hypertension, renal disorder who presents to the ED for mechanical fall.  Patient rolled out of bed about 3 feet and landed on her right side.  She was complaining of right hip and right shoulder pain.  She is severely hard of hearing.  Denies any other injuries or complaints.  Past Medical History:  Diagnosis Date  . Chronic GERD   . Dementia (HCC)   . Hypertension   . Renal disorder     There are no active problems to display for this patient.   Past Surgical History:  Procedure Laterality Date  . TONSILLECTOMY      Allergies Lisinopril and Macrobid [nitrofurantoin]  Social History Social History   Tobacco Use  . Smoking status: Never Smoker  . Smokeless tobacco: Never Used  Substance Use Topics  . Alcohol use: No  . Drug use: No   Review of Systems Constitutional: Negative for fever. Cardiovascular: Negative for chest pain. Respiratory: Negative for shortness of breath. Gastrointestinal: Negative for abdominal pain, vomiting and diarrhea. Musculoskeletal: Positive for right hip pain, right shoulder pain Skin: Negative for rash. Neurological: Negative for headaches, focal weakness or numbness.  All systems negative/normal/unremarkable except as stated in the HPI  ____________________________________________   PHYSICAL EXAM:  VITAL SIGNS: ED Triage Vitals  Enc Vitals Group     BP 03/02/18 0953 (!) 163/117     Pulse Rate 03/02/18 0953 62     Resp --      Temp 03/02/18 0953 (!) 97.5 F (36.4 C)     Temp Source 03/02/18 0953 Oral     SpO2 03/02/18 0953 99 %     Weight  03/02/18 0955 158 lb 11.7 oz (72 kg)     Height --      Head Circumference --      Peak Flow --      Pain Score 03/02/18 0955 8     Pain Loc --      Pain Edu? --      Excl. in GC? --    Constitutional: Alert, Well appearing and in no distress. Eyes: Conjunctivae are normal. Normal extraocular movements. ENT      Head: Normocephalic and atraumatic.      Nose: No congestion/rhinnorhea.      Mouth/Throat: Mucous membranes are moist.      Neck: No stridor. Cardiovascular: Normal rate, regular rhythm. No murmurs, rubs, or gallops. Respiratory: Normal respiratory effort without tachypnea nor retractions. Breath sounds are clear and equal bilaterally. No wheezes/rales/rhonchi. Gastrointestinal: Soft and nontender. Normal bowel sounds Musculoskeletal: Severe pain with range of motion of the right shoulder and right hip. Neurologic:  Normal speech and language. No gross focal neurologic deficits are appreciated.  Skin:  Skin is warm, dry and intact. No rash noted. Psychiatric: Mood and affect are normal. Speech and behavior are normal.  ____________________________________________  ED COURSE:  As part of my medical decision making, I reviewed the following data within the electronic MEDICAL RECORD NUMBER History obtained from family if available, nursing notes, old chart and ekg, as well as notes from prior ED visits. Patient presented for mechanical fall, we will assess with  labs and imaging as indicated at this time.   Procedures ____________________________________________   LABS (pertinent positives/negatives)  Labs Reviewed  CBC WITH DIFFERENTIAL/PLATELET - Abnormal; Notable for the following components:      Result Value   WBC 12.0 (*)    Neutro Abs 8.1 (*)    All other components within normal limits  COMPREHENSIVE METABOLIC PANEL - Abnormal; Notable for the following components:   Glucose, Bld 122 (*)    All other components within normal limits  URINALYSIS, COMPLETE (UACMP)  WITH MICROSCOPIC - Abnormal; Notable for the following components:   Color, Urine YELLOW (*)    APPearance CLEAR (*)    Nitrite POSITIVE (*)    Leukocytes, UA TRACE (*)    Bacteria, UA MANY (*)    All other components within normal limits  TROPONIN I  CBG MONITORING, ED    RADIOLOGY Images were viewed by me  CT head, right shoulder, right hip x-rays IMPRESSION: Similar findings of advanced atrophy and mild microvascular ischemic disease without superimposed acute intracranial process on this motion degraded examination. IMPRESSION: Acromioclavicular glenohumeral degenerative change. No acute bony Abnormality. IMPRESSION: 1. No acute findings. 2. Mild degenerative change of the right hip. ____________________________________________   DIFFERENTIAL DIAGNOSIS   Contusion, fracture, dehydration, electrolyte abnormality  FINAL ASSESSMENT AND PLAN  Fall, hip contusion, shoulder strain   Plan: The patient had presented for mechanical fall. Patient's labs did not reveal any acute process. Patient's imaging was reassuring.  She is cleared for outpatient follow-up.   Ulice Dash, MD    Note: This note was generated in part or whole with voice recognition software. Voice recognition is usually quite accurate but there are transcription errors that can and very often do occur. I apologize for any typographical errors that were not detected and corrected.     Emily Filbert, MD 03/02/18 1310

## 2018-03-02 NOTE — ED Notes (Signed)
PT ambulates with two person assist - daughter states that this is pt normal

## 2018-03-02 NOTE — ED Triage Notes (Signed)
Pt from [facility] via ems with reports that pt rolled out of bed about 3 ft high and landed on her rt side. Pt c/o rt hip, rt arm, shoulder pain. Pt is HOH.

## 2018-03-02 NOTE — ED Notes (Signed)
Patient transported to X-ray 

## 2018-03-02 NOTE — ED Notes (Signed)
Assisted pt to wheelchair and to car. Daughter with patient, driving pt home

## 2018-03-02 NOTE — ED Notes (Signed)
Pt voided in pull up - pt changed and purewick placed

## 2018-03-24 ENCOUNTER — Ambulatory Visit
Admission: EM | Admit: 2018-03-24 | Discharge: 2018-03-24 | Disposition: A | Discharge status: Home / Self Care | Payer: Medicare Other | Attending: Family Medicine | Admitting: Family Medicine

## 2018-03-24 DIAGNOSIS — N390 Urinary tract infection, site not specified: Secondary | ICD-10-CM | POA: Insufficient documentation

## 2018-03-24 LAB — URINALYSIS, COMPLETE (UACMP) WITH MICROSCOPIC
Glucose, UA: NEGATIVE mg/dL
Hgb urine dipstick: NEGATIVE
Nitrite: POSITIVE — AB
PROTEIN: 30 mg/dL — AB
RBC / HPF: NONE SEEN RBC/hpf (ref 0–5)
SPECIFIC GRAVITY, URINE: 1.025 (ref 1.005–1.030)
pH: 5 (ref 5.0–8.0)

## 2018-03-24 MED ORDER — CEPHALEXIN 500 MG PO CAPS: 500.0000 mg | ORAL_CAPSULE | Freq: Two times a day (BID) | ORAL | 0 refills | Status: DC

## 2018-03-24 NOTE — ED Triage Notes (Signed)
Pt here for UTI and her only symptom is she agitated.

## 2018-03-24 NOTE — ED Provider Notes (Signed)
[facility]    CSN: [id number] Arrival date & time: 03/24/18  1444     History   Chief Complaint Chief Complaint  Patient presents with  . Urinary Tract Infection    HPI Dana Curtis is a 85 y.o. female.   85 yo female brought in by daughter's from the group home where patient resides, with a c/o "being snippy" (ie not herself; behavior change). Patient denies any fevers, chills, vomiting, dysuria, abdominal pain.   The history is provided by the patient and a relative.  Urinary Tract Infection    Past Medical History:  Diagnosis Date  . Chronic GERD   . Dementia (HCC)   . Hypertension   . Renal disorder     There are no active problems to display for this patient.   Past Surgical History:  Procedure Laterality Date  . TONSILLECTOMY      OB History   No obstetric history on file.      Home Medications    Prior to Admission medications   Medication Sig Start Date End Date Taking? Authorizing Provider  amLODipine (NORVASC) 5 MG tablet Take 5 mg by mouth daily.   Yes [provider]  aspirin EC 81 MG tablet Take 81 mg by mouth daily.   Yes [provider]  Calcium 600-400 MG-UNIT CHEW Chew by mouth daily.   Yes [provider]  citalopram (CELEXA) 10 MG tablet Take 10 mg by mouth daily.   Yes [provider]  donepezil (ARICEPT) 10 MG tablet Take 10 mg by mouth at bedtime.   Yes [provider]  losartan (COZAAR) 100 MG tablet Take 100 mg by mouth daily.   Yes [provider]  Melatonin 3 MG TABS Take by mouth at bedtime.   Yes [provider]  Multiple Vitamin (MULTIVITAMIN) tablet Take 1 tablet by mouth daily.   Yes [provider]  oxybutynin (DITROPAN-XL) 5 MG 24 hr tablet Take 2.5 mg by mouth at bedtime.   Yes [provider]  ranitidine (ZANTAC) 75 MG tablet Take 75 mg by mouth daily.   Yes [provider]  cephALEXin (KEFLEX) 500 MG capsule Take 1  capsule (500 mg total) by mouth 2 (two) times daily. 03/24/18   Payton Mccallum, MD  Cranberry 450 MG TABS Take by mouth daily.    [provider]  nitrofurantoin, macrocrystal-monohydrate, (MACROBID) 100 MG capsule Take 1 capsule (100 mg total) by mouth 2 (two) times daily. 10/26/15   Jolene Provost, MD    Family History History reviewed. No pertinent family history.  Social History Social History   Tobacco Use  . Smoking status: Never Smoker  . Smokeless tobacco: Never Used  Substance Use Topics  . Alcohol use: No  . Drug use: No     Allergies   Lisinopril and Macrobid [nitrofurantoin]   Review of Systems Review of Systems   Physical Exam Triage Vital Signs ED Triage Vitals [03/24/18 1620]  Enc Vitals Group     BP (!) 131/53     Pulse Rate 72     Resp 18     Temp 97.9 F (36.6 C)     Temp Source Oral     SpO2 98 %     Weight 148 lb (67.1 kg)     Height 4\' 8"  (1.422 m)     Head Circumference      Peak Flow      Pain Score 0     Pain  Loc      Pain Edu?      Excl. in GC?    No data found.  Updated Vital Signs BP (!) 131/53 (BP Location: Left Arm)   Pulse 72   Temp 97.9 F (36.6 C) (Oral)   Resp 18   Ht 4\' 8"  (1.422 m)   Wt 67.1 kg   SpO2 98%   BMI 33.18 kg/m   Visual Acuity Right Eye Distance:   Left Eye Distance:   Bilateral Distance:    Right Eye Near:   Left Eye Near:    Bilateral Near:     Physical Exam Vitals signs and nursing note reviewed.  Constitutional:      General: She is not in acute distress.    Appearance: She is not toxic-appearing or diaphoretic.  Abdominal:     General: There is no distension.     Palpations: Abdomen is soft.     Tenderness: There is no abdominal tenderness. There is no right CVA tenderness, left CVA tenderness, guarding or rebound.  Neurological:     Mental Status: She is alert.      UC Treatments / Results  Labs (all labs ordered are listed, but only abnormal results are displayed) Labs  Reviewed  URINALYSIS, COMPLETE (UACMP) WITH MICROSCOPIC - Abnormal; Notable for the following components:      Result Value   APPearance CLOUDY (*)    Bilirubin Urine SMALL (*)    Ketones, ur TRACE (*)    Protein, ur 30 (*)    Nitrite POSITIVE (*)    Leukocytes, UA TRACE (*)    Bacteria, UA MANY (*)    All other components within normal limits    EKG None  Radiology No results found.  Procedures Procedures (including critical care time)  Medications Ordered in UC Medications - No data to display  Initial Impression / Assessment and Plan / UC Course  I have reviewed the triage vital signs and the nursing notes.  Pertinent labs & imaging results that were available during my care of the patient were reviewed by me and considered in my medical decision making (see chart for details).      Final Clinical Impressions(s) / UC Diagnoses   Final diagnoses:  Urinary tract infection without hematuria, site unspecified    ED Prescriptions    Medication Sig Dispense Auth. Provider   cephALEXin (KEFLEX) 500 MG capsule Take 1 capsule (500 mg total) by mouth 2 (two) times daily. 14 capsule Payton Mccallum, MD      1. Labs/x-ray results and diagnosis reviewed with patient and family 2. rx as per orders above; reviewed possible side effects, interactions, risks and benefits  3. Recommend supportive treatment with increased water intake 4. Follow-up prn if symptoms worsen or don't improve  Controlled Substance Prescriptions [place] Controlled Substance Registry consulted? Not Applicable   Payton Mccallum, MD 03/24/18 [phone]

## 2018-04-06 ENCOUNTER — Telehealth: Payer: Self-pay | Admitting: *Deleted

## 2018-04-06 NOTE — Telephone Encounter (Signed)
error 

## 2019-05-10 ENCOUNTER — Emergency Department: Payer: Medicare Other

## 2019-05-10 ENCOUNTER — Emergency Department
Admission: EM | Admit: 2019-05-10 | Discharge: 2019-05-11 | Disposition: A | Discharge status: Home / Self Care | Payer: Medicare Other | Attending: Emergency Medicine | Admitting: Emergency Medicine

## 2019-05-10 ENCOUNTER — Other Ambulatory Visit: Payer: Self-pay

## 2019-05-10 DIAGNOSIS — N390 Urinary tract infection, site not specified: Secondary | ICD-10-CM | POA: Insufficient documentation

## 2019-05-10 DIAGNOSIS — R4182 Altered mental status, unspecified: Secondary | ICD-10-CM | POA: Diagnosis present

## 2019-05-10 DIAGNOSIS — Z79899 Other long term (current) drug therapy: Secondary | ICD-10-CM | POA: Diagnosis not present

## 2019-05-10 DIAGNOSIS — I10 Essential (primary) hypertension: Secondary | ICD-10-CM | POA: Insufficient documentation

## 2019-05-10 DIAGNOSIS — R451 Restlessness and agitation: Secondary | ICD-10-CM | POA: Insufficient documentation

## 2019-05-10 DIAGNOSIS — F0390 Unspecified dementia without behavioral disturbance: Secondary | ICD-10-CM | POA: Diagnosis not present

## 2019-05-10 DIAGNOSIS — R1032 Left lower quadrant pain: Secondary | ICD-10-CM | POA: Diagnosis not present

## 2019-05-10 LAB — COMPREHENSIVE METABOLIC PANEL
ALT: 11 U/L (ref 0–44)
AST: 21 U/L (ref 15–41)
Albumin: 3.2 g/dL — ABNORMAL LOW (ref 3.5–5.0)
Alkaline Phosphatase: 59 U/L (ref 38–126)
Anion gap: 10 (ref 5–15)
BUN: 19 mg/dL (ref 8–23)
CO2: 25 mmol/L (ref 22–32)
Calcium: 8.3 mg/dL — ABNORMAL LOW (ref 8.9–10.3)
Chloride: 106 mmol/L (ref 98–111)
Creatinine, Ser: 0.71 mg/dL (ref 0.44–1.00)
GFR calc Af Amer: >60 mL/min (ref >60)
GFR calc non Af Amer: >60 mL/min (ref >60)
Glucose, Bld: 107 mg/dL — ABNORMAL HIGH (ref 70–99)
Potassium: 4 mmol/L (ref 3.5–5.1)
Sodium: 141 mmol/L (ref 135–145)
Total Bilirubin: 0.4 mg/dL (ref 0.3–1.2)
Total Protein: 6.3 g/dL — ABNORMAL LOW (ref 6.5–8.1)

## 2019-05-10 LAB — URINALYSIS, COMPLETE (UACMP) WITH MICROSCOPIC
Bilirubin Urine: NEGATIVE
Glucose, UA: NEGATIVE mg/dL
Ketones, ur: NEGATIVE mg/dL
Leukocytes,Ua: NEGATIVE
Nitrite: POSITIVE — AB
Protein, ur: NEGATIVE mg/dL
Specific Gravity, Urine: 1.01 (ref 1.005–1.030)
pH: 7 (ref 5.0–8.0)

## 2019-05-10 LAB — CBC WITH DIFFERENTIAL/PLATELET
Abs Immature Granulocytes: 0.04 10*3/uL (ref 0.00–0.07)
Basophils Absolute: 0 10*3/uL (ref 0.0–0.1)
Basophils Relative: 0 %
Eosinophils Absolute: 0.2 10*3/uL (ref 0.0–0.5)
Eosinophils Relative: 2 %
HCT: 40.1 % (ref 36.0–46.0)
Hemoglobin: 12.7 g/dL (ref 12.0–15.0)
Immature Granulocytes: 1 %
Lymphocytes Relative: 20 %
Lymphs Abs: 1.7 10*3/uL (ref 0.7–4.0)
MCH: 30.1 pg (ref 26.0–34.0)
MCHC: 31.7 g/dL (ref 30.0–36.0)
MCV: 95 fL (ref 80.0–100.0)
Monocytes Absolute: 0.7 10*3/uL (ref 0.1–1.0)
Monocytes Relative: 8 %
Neutro Abs: 5.5 10*3/uL (ref 1.7–7.7)
Neutrophils Relative %: 69 %
Platelets: 273 10*3/uL (ref 150–400)
RBC: 4.22 MIL/uL (ref 3.87–5.11)
RDW: 14.3 % (ref 11.5–15.5)
WBC: 8.1 10*3/uL (ref 4.0–10.5)
nRBC: 0 % (ref 0.0–0.2)

## 2019-05-10 LAB — LIPASE, BLOOD: Lipase: 20 U/L (ref 11–51)

## 2019-05-10 MED ORDER — HALOPERIDOL LACTATE 5 MG/ML IJ SOLN
5.0000 mg | Freq: Once | INTRAMUSCULAR | Status: AC
  Administered 2019-05-10: 5 mg via INTRAMUSCULAR
  Filled 2019-05-10: qty 1

## 2019-05-10 MED ORDER — CEPHALEXIN 250 MG PO CAPS: 250.0000 mg | ORAL_CAPSULE | Freq: Three times a day (TID) | ORAL | 0 refills | Status: DC

## 2019-05-10 MED ORDER — IOHEXOL 300 MG/ML  SOLN
100.0000 mL | Freq: Once | INTRAMUSCULAR | Status: AC | PRN
  Administered 2019-05-10: 100 mL via INTRAVENOUS

## 2019-05-10 MED ORDER — HALOPERIDOL LACTATE 5 MG/ML IJ SOLN
2.5000 mg | Freq: Once | INTRAMUSCULAR | Status: AC
  Administered 2019-05-10: 2.5 mg via INTRAVENOUS

## 2019-05-10 MED ORDER — CEPHALEXIN 250 MG PO CAPS: 250.0000 mg | ORAL_CAPSULE | Freq: Once | ORAL | Status: DC

## 2019-05-10 MED ORDER — HALOPERIDOL LACTATE 5 MG/ML IJ SOLN: 2.5000 mg | Freq: Once | INTRAMUSCULAR | Status: DC

## 2019-05-10 NOTE — ED Notes (Signed)
Pt is now sleeping. Sitter Removed.

## 2019-05-10 NOTE — ED Provider Notes (Signed)
[facility] Emergency Department Provider Note  ____________________________________________  Time seen: Approximately 10:59 PM  I have reviewed the triage vital signs and the nursing notes.   HISTORY  Chief Complaint Altered Mental Status    Level 5 Caveat: Portions of the History and Physical including HPI and review of systems are unable to be completely obtained due to patient being a poor historian   HPI Dana Curtis is a 86 y.o. female with a history of hypertension, dementia, GERD sent to the ED from her facility ([facility]) for increased confusion and agitation today.  Also reportedly the patient had exhibited some left lower quadrant abdominal pain.  No vomiting or diarrhea.  No fevers.  No other acute complaints.  Patient denies any symptoms currently.      Past Medical History:  Diagnosis Date  . Chronic GERD   . Dementia (HCC)   . Hypertension   . Renal disorder      There are no problems to display for this patient.    Past Surgical History:  Procedure Laterality Date  . TONSILLECTOMY       Prior to Admission medications   Medication Sig Start Date End Date Taking? Authorizing Provider  acetaminophen (TYLENOL) 500 MG tablet Take 1,000 mg by mouth in the morning and at bedtime.   Yes [provider]  amLODipine (NORVASC) 5 MG tablet Take 5 mg by mouth at bedtime.    Yes [provider]  buPROPion (WELLBUTRIN SR) 100 MG 12 hr tablet Take 100 mg by mouth daily.   Yes [provider]  Calcium Carbonate-Vitamin D (CALCIUM 600+D HIGH POTENCY) 600-400 MG-UNIT tablet Take 1 tablet by mouth at bedtime.   Yes [provider]  carbamide peroxide (MURINE EAR) 6.5 % OTIC solution Place 3 drops into both ears daily.   Yes [provider]  clotrimazole (LOTRIMIN) 1 % cream Apply 1 application topically 2 (two) times daily.   Yes [provider]  Cranberry 450 MG TABS Take 450 mg by mouth at  bedtime.    Yes [provider]  diclofenac Sodium (VOLTAREN) 1 % GEL Apply 2 g topically in the morning and at bedtime. To right shoulder   Yes [provider]  esomeprazole (NEXIUM) 40 MG capsule Take 40 mg by mouth daily at 12 noon.   Yes [provider]  hydrOXYzine (ATARAX/VISTARIL) 25 MG tablet Take 25 mg by mouth 2 (two) times daily as needed for anxiety.   Yes [provider]  losartan (COZAAR) 100 MG tablet Take 100 mg by mouth at bedtime.    Yes [provider]  Menthol, Topical Analgesic, (BIOFREEZE EX) Apply topically 3 (three) times daily as needed (pain). Apply to feet, shoulders, or other muscles and joints   Yes [provider]     Allergies Lisinopril and Macrobid [nitrofurantoin]   No family history on file.  Social History Social History   Tobacco Use  . Smoking status: Never Smoker  . Smokeless tobacco: Never Used  Substance Use Topics  . Alcohol use: No  . Drug use: No    Review of Systems Level 5 Caveat: Portions of the History and Physical including HPI and review of systems are unable to be completely obtained due to patient being a poor historian   Constitutional:   No known fever.  ENT:   No rhinorrhea. Cardiovascular:   No chest pain or syncope. Respiratory:   No dyspnea or cough. Gastrointestinal: Positive as above for  abdominal pain without vomiting and diarrhea.  Musculoskeletal:   Negative for focal pain or swelling ____________________________________________   PHYSICAL EXAM:  VITAL SIGNS: ED Triage Vitals  Enc Vitals Group     BP 05/10/19 2023 (!) 139/53     Pulse Rate 05/10/19 2023 83     Resp 05/10/19 2023 16     Temp --      Temp src --      SpO2 05/10/19 2023 98 %     Weight 05/10/19 2026 143 lb 1.3 oz (64.9 kg)     Height 05/10/19 2026 5\' 5"  (1.651 m)     Head Circumference --      Peak Flow --      Pain Score --      Pain Loc --      Pain Edu? --      Excl. in GC? --      Vital signs reviewed, nursing assessments reviewed. Exam limited by patient's inability to meaningfully participate  Constitutional:   Awake and alert, oriented to self. Non-toxic appearance. Eyes:   Conjunctivae are normal. EOMI. PERRL. ENT      Head:   Normocephalic and atraumatic.      Nose:   No congestion/rhinnorhea.       Mouth/Throat:   MMM, no pharyngeal erythema. No peritonsillar mass.       Neck:   No meningismus. Full ROM. Hematological/Lymphatic/Immunilogical:   No cervical lymphadenopathy. Cardiovascular:   RRR. Symmetric bilateral radial and DP pulses.  No murmurs. Cap refill less than 2 seconds. Respiratory:   Normal respiratory effort without tachypnea/retractions. Breath sounds are clear and equal bilaterally. No wheezes/rales/rhonchi. Gastrointestinal:   Soft with mild distention.  Large ventral hernia, soft.  No focal tenderness, but patient says "that hurts" with any palpation of the abdomen. There is no CVA tenderness.  No rebound, rigidity, or guarding. Musculoskeletal:   Normal range of motion in all extremities. No joint effusions.  No lower extremity tenderness.  No edema. Neurologic:   Normal speech and language.  Motor grossly intact. No acute focal neurologic deficits are appreciated.  Skin:    Skin is warm, dry and intact. No rash noted.  No petechiae, purpura, or bullae.  ____________________________________________    LABS (pertinent positives/negatives) (all labs ordered are listed, but only abnormal results are displayed) Labs Reviewed  COMPREHENSIVE METABOLIC PANEL - Abnormal; Notable for the following components:      Result Value   Glucose, Bld 107 (*)    Calcium 8.3 (*)    Total Protein 6.3 (*)    Albumin 3.2 (*)    All other components within normal limits  LIPASE, BLOOD  CBC WITH DIFFERENTIAL/PLATELET  URINALYSIS, COMPLETE (UACMP) WITH MICROSCOPIC    ____________________________________________   EKG    ____________________________________________    RADIOLOGY  CT ABDOMEN PELVIS W CONTRAST  Result Date: 05/10/2019 CLINICAL DATA:  Abdominal distension foot EXAM: CT ABDOMEN AND PELVIS WITH CONTRAST TECHNIQUE: Multidetector CT imaging of the abdomen and pelvis was performed using the standard protocol following bolus administration of intravenous contrast. CONTRAST:  05/12/2019 OMNIPAQUE IOHEXOL 300 MG/ML  SOLN COMPARISON:  July 28, 2016 FINDINGS: Lower chest: There is mild cardiomegaly. A moderate hiatal hernia is present. Mild streaky atelectasis seen at both lung bases. Hepatobiliary: There is slight interval increase in size in the low-density lesion in the anterior left liver lobe measuring 2.9 cm, previously 2.0 cm, likely hepatic cyst. The main portal vein is patent. No evidence of calcified gallstones,  gallbladder wall thickening or biliary dilatation. Pancreas: Unremarkable. No pancreatic ductal dilatation or surrounding inflammatory changes. Spleen: Normal in size without focal abnormality. Adrenals/Urinary Tract: Both adrenal glands appear normal. The kidneys and collecting system appear normal without evidence of urinary tract calculus or hydronephrosis. Bladder is unremarkable. Stomach/Bowel: The stomach, small bowel, and colon are normal in appearance. No inflammatory changes, wall thickening, or obstructive findings.Scattered colonic diverticula are noted without diverticulitis. Vascular/Lymphatic: There are no enlarged mesenteric, retroperitoneal, or pelvic lymph nodes. Scattered aortic atherosclerotic calcifications are seen without aneurysmal dilatation. Reproductive: The patient is status post hysterectomy. No adnexal masses or collections seen. Other: There is an anterior umbilical wall hernia which contains loops of bowel, mesentery, and fat measuring 7.5 cm in transverse dimension and 3.6 cm in AP dimension. No evidence of bowel  strangulation however is noted. For Musculoskeletal: No acute or significant osseous findings. There is diffuse osteopenia. Degenerative changes seen throughout the thoracolumbar spine. IMPRESSION: Anterior umbilical wall hernia containing small bowel fat and mesentery. No evidence of bowel strangulation. Diverticulosis without diverticulitis. Aortic Atherosclerosis (ICD10-I70.0). Electronically Signed   By: Prudencio Pair M.D.   On: 05/10/2019 22:34    ____________________________________________   PROCEDURES Procedures  ____________________________________________  DIFFERENTIAL DIAGNOSIS   Bowel obstruction, incarcerated hernia, UTI, dehydration, electrolyte abnormality, diverticulitis  CLINICAL IMPRESSION / ASSESSMENT AND PLAN / ED COURSE  Medications ordered in the ED: Medications  haloperidol lactate (HALDOL) injection 5 mg (5 mg Intramuscular Given 05/10/19 2035)  iohexol (OMNIPAQUE) 300 MG/ML solution 100 mL (100 mLs Intravenous Contrast Given 05/10/19 2211)    Pertinent labs & imaging results that were available during my care of the patient were reviewed by me and considered in my medical decision making (see chart for details).   Dana Curtis was evaluated in Emergency Department on 05/10/2019 for the symptoms described in the history of present illness. She was evaluated in the context of the global COVID-19 pandemic, which necessitated consideration that the patient might be at risk for infection with the SARS-CoV-2 virus that causes COVID-19. Institutional protocols and algorithms that pertain to the evaluation of patients at risk for COVID-19 are in a state of rapid change based on information released by regulatory bodies including the CDC and federal and state organizations. These policies and algorithms were followed during the patient's care in the ED.   Patient with advanced dementia presents with increased confusion and agitation today, possibly abdominal pain.  Vital signs  unremarkable.  History and exam are significantly limited but she is not in distress.  Serum labs and CT scan are unremarkable.  Awaiting urinalysis to determine if antibiotics are needed, anticipate she will be discharged back home to her memory care after.      ____________________________________________   FINAL CLINICAL IMPRESSION(S) / ED DIAGNOSES    Final diagnoses:  Dementia without behavioral disturbance, unspecified dementia type [facility])  Agitation     ED Discharge Orders    None      Portions of this note were generated with dragon dictation software. Dictation errors may occur despite best attempts at proofreading.   Carrie Mew, MD 05/10/19 2303

## 2019-05-10 NOTE — ED Notes (Signed)
Pt in ct 

## 2019-05-10 NOTE — ED Triage Notes (Signed)
Pt to ED via EMS from [facility], Per facility pt has been more altered than normal today and has noticed some LLQ abd distension. Pt Has hx of dementia and is combative. Pt resting comfortably in bed, states she is cold.

## 2019-05-10 NOTE — ED Provider Notes (Signed)
-----------------------------------------   11:45 PM on 05/10/2019 -----------------------------------------  UA is nitrite positive with WBCs.  Will treat with Keflex.  Patient resting in no acute distress.  Will discharge back to facility with close PCP follow-up.  Strict return precautions given.  No family at bedside; patient verbalizes understanding agrees with plan of care.   Irean Hong, MD 05/11/19 [phone]

## 2019-05-10 NOTE — ED Notes (Signed)
Family at bedside updated, pt sleeping in bed calm

## 2019-05-10 NOTE — ED Notes (Signed)
Pt given warm blankets.

## 2019-05-10 NOTE — Discharge Instructions (Addendum)
Your blood tests and CT scan were all okay today.  Take the antibiotic as prescribed - Keflex 250 mg 3 times daily x7 days.  Return to the ER for worsening symptoms, persistent vomiting, fever, difficulty breathing or other concerns.

## 2019-06-10 ENCOUNTER — Other Ambulatory Visit: Payer: Self-pay

## 2019-06-10 ENCOUNTER — Emergency Department
Admission: EM | Admit: 2019-06-10 | Discharge: 2019-06-10 | Disposition: A | Discharge status: Home / Self Care | Payer: Medicare Other | Attending: Emergency Medicine | Admitting: Emergency Medicine

## 2019-06-10 ENCOUNTER — Encounter: Payer: Self-pay | Admitting: Emergency Medicine

## 2019-06-10 DIAGNOSIS — R451 Restlessness and agitation: Secondary | ICD-10-CM | POA: Diagnosis not present

## 2019-06-10 DIAGNOSIS — F0390 Unspecified dementia without behavioral disturbance: Secondary | ICD-10-CM | POA: Diagnosis not present

## 2019-06-10 DIAGNOSIS — R109 Unspecified abdominal pain: Secondary | ICD-10-CM | POA: Diagnosis present

## 2019-06-10 DIAGNOSIS — Z79899 Other long term (current) drug therapy: Secondary | ICD-10-CM | POA: Insufficient documentation

## 2019-06-10 DIAGNOSIS — I10 Essential (primary) hypertension: Secondary | ICD-10-CM | POA: Diagnosis not present

## 2019-06-10 LAB — CBC
HCT: 41.2 % (ref 36.0–46.0)
Hemoglobin: 13.3 g/dL (ref 12.0–15.0)
MCH: 29.9 pg (ref 26.0–34.0)
MCHC: 32.3 g/dL (ref 30.0–36.0)
MCV: 92.6 fL (ref 80.0–100.0)
Platelets: 302 10*3/uL (ref 150–400)
RBC: 4.45 MIL/uL (ref 3.87–5.11)
RDW: 14 % (ref 11.5–15.5)
WBC: 10.9 10*3/uL — ABNORMAL HIGH (ref 4.0–10.5)
nRBC: 0 % (ref 0.0–0.2)

## 2019-06-10 LAB — COMPREHENSIVE METABOLIC PANEL
ALT: 12 U/L (ref 0–44)
AST: 14 U/L — ABNORMAL LOW (ref 15–41)
Albumin: 3.7 g/dL (ref 3.5–5.0)
Alkaline Phosphatase: 70 U/L (ref 38–126)
Anion gap: 7 (ref 5–15)
BUN: 20 mg/dL (ref 8–23)
CO2: 29 mmol/L (ref 22–32)
Calcium: 9.2 mg/dL (ref 8.9–10.3)
Chloride: 105 mmol/L (ref 98–111)
Creatinine, Ser: 0.69 mg/dL (ref 0.44–1.00)
GFR calc Af Amer: >60 mL/min (ref >60)
GFR calc non Af Amer: >60 mL/min (ref >60)
Glucose, Bld: 82 mg/dL (ref 70–99)
Potassium: 4.5 mmol/L (ref 3.5–5.1)
Sodium: 141 mmol/L (ref 135–145)
Total Bilirubin: 0.4 mg/dL (ref 0.3–1.2)
Total Protein: 7 g/dL (ref 6.5–8.1)

## 2019-06-10 LAB — URINALYSIS, COMPLETE (UACMP) WITH MICROSCOPIC
Bacteria, UA: NONE SEEN
Bilirubin Urine: NEGATIVE
Glucose, UA: NEGATIVE mg/dL
Hgb urine dipstick: NEGATIVE
Ketones, ur: NEGATIVE mg/dL
Leukocytes,Ua: NEGATIVE
Nitrite: NEGATIVE
Specific Gravity, Urine: 1.025 (ref 1.005–1.030)
pH: 6.5 (ref 5.0–8.0)

## 2019-06-10 LAB — LIPASE, BLOOD: Lipase: 26 U/L (ref 11–51)

## 2019-06-10 NOTE — Discharge Instructions (Addendum)
Dana Curtis should return to the emergency department, for new, worsening, recurrent agitation or change in mental status, abdominal pain, vomiting, fever, weakness, or any other new or worsening symptoms.  She may follow-up with her regular doctor.

## 2019-06-10 NOTE — ED Provider Notes (Signed)
[facility] Emergency Department Provider Note ____________________________________________   First MD Initiated Contact with Patient 06/10/19 1215     (approximate)  I have reviewed the triage vital signs and the nursing notes.   HISTORY  Chief Complaint Abdominal Pain  Level 5 caveat: History present illness limited due to dementia  HPI Dana Curtis is a 86 y.o. female with PMH as noted below who presents with an episode of agitation this morning.  The history is unclear, but apparently at some point the patient also reported some abdominal pain.  Currently, the patient was without complaints.  She realizes that she is in the hospital but does not know why she was brought here and denies any symptoms.  Past Medical History:  Diagnosis Date  . Chronic GERD   . Dementia (HCC)   . Hypertension   . Renal disorder     There are no problems to display for this patient.   Past Surgical History:  Procedure Laterality Date  . TONSILLECTOMY      Prior to Admission medications   Medication Sig Start Date End Date Taking? Authorizing Provider  acetaminophen (TYLENOL) 500 MG tablet Take 1,000 mg by mouth in the morning and at bedtime.    [provider]  amLODipine (NORVASC) 5 MG tablet Take 5 mg by mouth at bedtime.     [provider]  buPROPion (WELLBUTRIN SR) 100 MG 12 hr tablet Take 100 mg by mouth daily.    [provider]  Calcium Carbonate-Vitamin D (CALCIUM 600+D HIGH POTENCY) 600-400 MG-UNIT tablet Take 1 tablet by mouth at bedtime.    [provider]  carbamide peroxide (MURINE EAR) 6.5 % OTIC solution Place 3 drops into both ears daily.    [provider]  cephALEXin (KEFLEX) 250 MG capsule Take 1 capsule (250 mg total) by mouth 3 (three) times daily. 05/10/19   Irean Hong, MD  clotrimazole (LOTRIMIN) 1 % cream Apply 1 application topically 2 (two) times daily.    [provider]  Cranberry  450 MG TABS Take 450 mg by mouth at bedtime.     [provider]  diclofenac Sodium (VOLTAREN) 1 % GEL Apply 2 g topically in the morning and at bedtime. To right shoulder    [provider]  esomeprazole (NEXIUM) 40 MG capsule Take 40 mg by mouth daily at 12 noon.    [provider]  hydrOXYzine (ATARAX/VISTARIL) 25 MG tablet Take 25 mg by mouth 2 (two) times daily as needed for anxiety.    [provider]  losartan (COZAAR) 100 MG tablet Take 100 mg by mouth at bedtime.     [provider]  Menthol, Topical Analgesic, (BIOFREEZE EX) Apply topically 3 (three) times daily as needed (pain). Apply to feet, shoulders, or other muscles and joints    [provider]    Allergies Lisinopril and Macrobid [nitrofurantoin]  History reviewed. No pertinent family history.  Social History Social History   Tobacco Use  . Smoking status: Never Smoker  . Smokeless tobacco: Never Used  Substance Use Topics  . Alcohol use: No  . Drug use: No    Review of Systems Level 5 caveat: Unable to obtain review of systems due to dementia    ____________________________________________   PHYSICAL EXAM:  VITAL SIGNS: ED Triage Vitals  Enc Vitals Group     BP 06/10/19 1201 (!) 164/63     Pulse Rate 06/10/19 1202 70  Resp 06/10/19 1201 (!) 21     Temp 06/10/19 1202 98.1 F (36.7 C)     Temp Source 06/10/19 1202 Oral     SpO2 06/10/19 1202 96 %     Weight 06/10/19 1202 141 lb 1.5 oz (64 kg)     Height 06/10/19 1202 5\' 5"  (1.651 m)     Head Circumference --      Peak Flow --      Pain Score --      Pain Loc --      Pain Edu? --      Excl. in GC? --     Constitutional: Alert, oriented x2, somewhat confused.  Comfortable appearing. Eyes: Conjunctivae are normal.  EOMI.  PERRLA. Head: Atraumatic. Nose: No congestion/rhinnorhea. Mouth/Throat: Mucous membranes are moist.   Neck: Normal range of motion.  Cardiovascular: Normal rate,  regular rhythm. Grossly normal heart sounds.  Good peripheral circulation. Respiratory: Normal respiratory effort.  No retractions. Lungs CTAB. Gastrointestinal: Soft and nontender. No distention.  Genitourinary: No flank tenderness. Musculoskeletal: No lower extremity edema.  Extremities warm and well perfused.  Neurologic:  Normal speech and language.  Motor intact in all extremities. Skin:  Skin is warm and dry. No rash noted. Psychiatric: Calm and cooperative.  ____________________________________________   LABS (all labs ordered are listed, but only abnormal results are displayed)  Labs Reviewed  COMPREHENSIVE METABOLIC PANEL - Abnormal; Notable for the following components:      Result Value   AST 14 (*)    All other components within normal limits  CBC - Abnormal; Notable for the following components:   WBC 10.9 (*)    All other components within normal limits  URINALYSIS, COMPLETE (UACMP) WITH MICROSCOPIC - Abnormal; Notable for the following components:   Protein, ur TRACE (*)    All other components within normal limits  LIPASE, BLOOD   ____________________________________________  EKG  ED ECG REPORT I, , the attending physician, personally viewed and interpreted this ECG.  Date: 06/10/2019 EKG Time: 1238 Rate: 69 Rhythm: normal sinus rhythm QRS Axis: normal Intervals: normal ST/T Wave abnormalities: normal Narrative Interpretation: no evidence of acute ischemia  ____________________________________________  RADIOLOGY    ____________________________________________   PROCEDURES  Procedure(s) performed: No  Procedures  Critical Care performed: No ____________________________________________   INITIAL IMPRESSION / ASSESSMENT AND PLAN / ED COURSE  Pertinent labs & imaging results that were available during my care of the patient were reviewed by me and considered in my medical decision making (see chart for  details).  86 year old female with PMH as noted above presents with an episode of agitation, and possibly with abdominal pain.  The patient has dementia, but denies any complaints at this time.  On exam she is overall quite well-appearing for her age.  Her vital signs are normal except for mild hypertension.  The abdomen is soft and nontender.  Neurologic exam is nonfocal.  The physical exam is otherwise unremarkable.  Overall, given that the patient's symptoms are unclear, the etiology of her presentation is also uncertain.  It seems that the primary issue was actually agitation, which seems to have resolved.  I will obtain labs including a urinalysis and attempt to get further information.  If the lab work-up is negative and the patient remains asymptomatic, anticipate discharge back to her facility.  ----------------------------------------- 4:15 PM on 06/10/2019 -----------------------------------------  I spoke with the patient's daughter who came to the ED.  She confirms that the patient is at her baseline  mental status, and that the primary issue was that the patient became agitated this morning.  Facility staff was concerned for UTI.  The patient has intermittently complained of abdominal pain, but the daughter reports that this is a chronic issue.  The patient continues to deny any complaints at this time.  Lab work-up including urinalysis is negative for acute findings.  At this time, given that she is at her baseline and has no symptoms, the patient is stable for discharge back to her facility.  The daughter agrees with this plan.  Return precautions given, and she expresses understanding.  ____________________________________________   FINAL CLINICAL IMPRESSION(S) / ED DIAGNOSES  Final diagnoses:  Agitation      NEW MEDICATIONS STARTED DURING THIS VISIT:  Discharge Medication List as of 06/10/2019  2:47 PM       Note:  This document was prepared using Dragon voice  recognition software and may include unintentional dictation errors.   Arta Silence, MD 06/10/19 1616

## 2019-06-10 NOTE — ED Notes (Signed)
Spoke with the daughter at bedside. Daughter states that [facility] called out for agitation and the pt being combative. Pt has frequent UTIs. Daughter also states that her abdominal pain has been chronic and the doctors has not found anything wrong with her abdomen.

## 2019-06-10 NOTE — ED Notes (Signed)
Spoke with Judeth Cornfield at [facility]. Daughter will take patient back to facility.

## 2019-06-10 NOTE — ED Triage Notes (Addendum)
Pt via EMS from [facility]. Pt c/o abdominal pain since this morning. Pt has a hx of dementia, pt not really able to tell this RN about the abdominal pain. When asked questions, pt repeating states "I don't know. I'm cold"

## 2019-11-03 ENCOUNTER — Other Ambulatory Visit: Payer: Self-pay

## 2019-11-03 ENCOUNTER — Emergency Department
Admission: EM | Admit: 2019-11-03 | Discharge: 2019-11-03 | Disposition: A | Discharge status: Home / Self Care | Payer: Medicare Other | Attending: Emergency Medicine | Admitting: Emergency Medicine

## 2019-11-03 DIAGNOSIS — K219 Gastro-esophageal reflux disease without esophagitis: Secondary | ICD-10-CM | POA: Diagnosis not present

## 2019-11-03 DIAGNOSIS — I10 Essential (primary) hypertension: Secondary | ICD-10-CM | POA: Diagnosis not present

## 2019-11-03 DIAGNOSIS — N390 Urinary tract infection, site not specified: Secondary | ICD-10-CM | POA: Insufficient documentation

## 2019-11-03 DIAGNOSIS — Z79899 Other long term (current) drug therapy: Secondary | ICD-10-CM | POA: Insufficient documentation

## 2019-11-03 DIAGNOSIS — F0390 Unspecified dementia without behavioral disturbance: Secondary | ICD-10-CM | POA: Diagnosis not present

## 2019-11-03 LAB — CBC WITH DIFFERENTIAL/PLATELET
Abs Immature Granulocytes: 0.06 10*3/uL (ref 0.00–0.07)
Basophils Absolute: 0.1 10*3/uL (ref 0.0–0.1)
Basophils Relative: 0 %
Eosinophils Absolute: 0.3 10*3/uL (ref 0.0–0.5)
Eosinophils Relative: 2 %
HCT: 41.2 % (ref 36.0–46.0)
Hemoglobin: 13.2 g/dL (ref 12.0–15.0)
Immature Granulocytes: 0 %
Lymphocytes Relative: 15 %
Lymphs Abs: 2.1 10*3/uL (ref 0.7–4.0)
MCH: 30.2 pg (ref 26.0–34.0)
MCHC: 32 g/dL (ref 30.0–36.0)
MCV: 94.3 fL (ref 80.0–100.0)
Monocytes Absolute: 0.9 10*3/uL (ref 0.1–1.0)
Monocytes Relative: 6 %
Neutro Abs: 10.9 10*3/uL — ABNORMAL HIGH (ref 1.7–7.7)
Neutrophils Relative %: 77 %
Platelets: 294 10*3/uL (ref 150–400)
RBC: 4.37 MIL/uL (ref 3.87–5.11)
RDW: 14.7 % (ref 11.5–15.5)
WBC: 14.2 10*3/uL — ABNORMAL HIGH (ref 4.0–10.5)
nRBC: 0 % (ref 0.0–0.2)

## 2019-11-03 LAB — URINALYSIS, COMPLETE (UACMP) WITH MICROSCOPIC
Bilirubin Urine: NEGATIVE
Glucose, UA: NEGATIVE mg/dL
Hgb urine dipstick: NEGATIVE
Ketones, ur: NEGATIVE mg/dL
Nitrite: POSITIVE — AB
Protein, ur: 30 mg/dL — AB
Specific Gravity, Urine: 1.026 (ref 1.005–1.030)
pH: 5 (ref 5.0–8.0)

## 2019-11-03 LAB — COMPREHENSIVE METABOLIC PANEL
ALT: 14 U/L (ref 0–44)
AST: 23 U/L (ref 15–41)
Albumin: 3.9 g/dL (ref 3.5–5.0)
Alkaline Phosphatase: 85 U/L (ref 38–126)
Anion gap: 13 (ref 5–15)
BUN: 24 mg/dL — ABNORMAL HIGH (ref 8–23)
CO2: 27 mmol/L (ref 22–32)
Calcium: 9.1 mg/dL (ref 8.9–10.3)
Chloride: 103 mmol/L (ref 98–111)
Creatinine, Ser: 0.68 mg/dL (ref 0.44–1.00)
GFR calc Af Amer: >60 mL/min (ref >60)
GFR calc non Af Amer: >60 mL/min (ref >60)
Glucose, Bld: 120 mg/dL — ABNORMAL HIGH (ref 70–99)
Potassium: 3.7 mmol/L (ref 3.5–5.1)
Sodium: 143 mmol/L (ref 135–145)
Total Bilirubin: 0.5 mg/dL (ref 0.3–1.2)
Total Protein: 7.3 g/dL (ref 6.5–8.1)

## 2019-11-03 MED ORDER — SODIUM CHLORIDE 0.9 % IV SOLN
1.0000 g | Freq: Once | INTRAVENOUS | Status: DC
  Filled 2019-11-03: qty 10

## 2019-11-03 MED ORDER — CEPHALEXIN 500 MG PO CAPS: 500.0000 mg | ORAL_CAPSULE | Freq: Two times a day (BID) | ORAL | 0 refills | Status: DC

## 2019-11-03 MED ORDER — CEFTRIAXONE SODIUM 250 MG IJ SOLR
250.0000 mg | Freq: Once | INTRAMUSCULAR | Status: AC
  Administered 2019-11-03: 250 mg via INTRAMUSCULAR
  Filled 2019-11-03: qty 250

## 2019-11-03 NOTE — ED Notes (Signed)
Patient stated she did not want the IV. Daughter at bedside. Dr. Lenard Lance aware.

## 2019-11-03 NOTE — ED Notes (Signed)
Patient assisted to room commode with twp person assist. Patient had a halting gait.

## 2019-11-03 NOTE — ED Provider Notes (Signed)
[facility] Emergency Department Provider Note  Time seen: 8:11 PM  I have reviewed the triage vital signs and the nursing notes.   HISTORY  Chief Complaint Urinary Tract Infection   HPI Dana Curtis is a 86 y.o. female with a past medical history of dementia, gastric reflux, hypertension presents to the emergency department with with her daughter with complaints of painful urination.  According to the daughter the patient has been complaining of painful urination at times.  Patient has dementia does not recall complaining of this, is not sure why she is here, cannot contribute to her history or review of systems.  Daughter is here who states the patient frequently gets urinary tract infections.   Past Medical History:  Diagnosis Date  . Chronic GERD   . Dementia (HCC)   . Hypertension   . Renal disorder     There are no problems to display for this patient.   Past Surgical History:  Procedure Laterality Date  . TONSILLECTOMY      Prior to Admission medications   Medication Sig Start Date End Date Taking? Authorizing Provider  acetaminophen (TYLENOL) 500 MG tablet Take 1,000 mg by mouth in the morning and at bedtime.    [provider]  amLODipine (NORVASC) 5 MG tablet Take 5 mg by mouth at bedtime.     [provider]  buPROPion (WELLBUTRIN SR) 100 MG 12 hr tablet Take 100 mg by mouth daily.    [provider]  Calcium Carbonate-Vitamin D (CALCIUM 600+D HIGH POTENCY) 600-400 MG-UNIT tablet Take 1 tablet by mouth at bedtime.    [provider]  carbamide peroxide (MURINE EAR) 6.5 % OTIC solution Place 3 drops into both ears daily.    [provider]  cephALEXin (KEFLEX) 250 MG capsule Take 1 capsule (250 mg total) by mouth 3 (three) times daily. 05/10/19   Irean Hong, MD  clotrimazole (LOTRIMIN) 1 % cream Apply 1 application topically 2 (two) times daily.    [provider]  Cranberry 450 MG TABS  Take 450 mg by mouth at bedtime.     [provider]  diclofenac Sodium (VOLTAREN) 1 % GEL Apply 2 g topically in the morning and at bedtime. To right shoulder    [provider]  esomeprazole (NEXIUM) 40 MG capsule Take 40 mg by mouth daily at 12 noon.    [provider]  hydrOXYzine (ATARAX/VISTARIL) 25 MG tablet Take 25 mg by mouth 2 (two) times daily as needed for anxiety.    [provider]  losartan (COZAAR) 100 MG tablet Take 100 mg by mouth at bedtime.     [provider]  Menthol, Topical Analgesic, (BIOFREEZE EX) Apply topically 3 (three) times daily as needed (pain). Apply to feet, shoulders, or other muscles and joints    [provider]    Allergies  Allergen Reactions  . Lisinopril   . Macrobid [Nitrofurantoin]     History reviewed. No pertinent family history.  Social History Social History   Tobacco Use  . Smoking status: Never Smoker  . Smokeless tobacco: Never Used  Vaping Use  . Vaping Use: Never used  Substance Use Topics  . Alcohol use: No  . Drug use: No    Review of Systems Unable to obtain adequate/accurate review of systems due to baseline dementia.  ____________________________________________   PHYSICAL EXAM:  VITAL SIGNS: ED Triage Vitals  Enc Vitals Group     BP 11/03/19 1751 Marland Kitchen)  142/52     Pulse Rate 11/03/19 1751 91     Resp 11/03/19 1751 18     Temp 11/03/19 1751 98.6 F (37 C)     Temp Source 11/03/19 1751 Oral     SpO2 11/03/19 1751 97 %     Weight 11/03/19 1750 140 lb (63.5 kg)     Height 11/03/19 1750 4\' 10"  (1.473 m)     Head Circumference --      Peak Flow --      Pain Score 11/03/19 1807 8     Pain Loc --      Pain Edu? --      Excl. in GC? --     Constitutional: Awake alert, no acute distress.  Patient is confused but at baseline.   Eyes: Normal exam ENT      Head: Normocephalic and atraumatic.      Mouth/Throat: Mucous membranes are moist. Cardiovascular: Normal  rate, regular rhythm. Respiratory: Normal respiratory effort without tachypnea nor retractions. Breath sounds are clear Gastrointestinal: Soft and nontender. No distention.  No reaction to palpation. Neurologic:  Normal speech and language. No gross focal neurologic deficits Skin:  Skin is warm, dry and intact.  Psychiatric: Mood and affect are normal.  ____________________________________________    INITIAL IMPRESSION / ASSESSMENT AND PLAN / ED COURSE  Pertinent labs & imaging results that were available during my care of the patient were reviewed by me and considered in my medical decision making (see chart for details).   Patient presents emergency department for painful urination.  Patient has a history of dementia and cannot contribute to her history.  Daughter states patient frequently gets urinary tract infections and she was concerned because she has been intermittently complaining of discomfort when urinating.  Patient's labs show a mild leukocytosis 14,000 with a urinalysis consistent with urinary tract infection nitrite positive with many bacteria.  We will send a urine culture.  We will dose IV Rocephin in the emergency department.  Overall the patient appears well acting at her baseline with reassuring vitals anticipate likely discharge to her nursing facility with oral antibiotics.  Daughter agreeable to plan of care.  Dana Curtis was evaluated in Emergency Department on 11/03/2019 for the symptoms described in the history of present illness. She was evaluated in the context of the global COVID-19 pandemic, which necessitated consideration that the patient might be at risk for infection with the SARS-CoV-2 virus that causes COVID-19. Institutional protocols and algorithms that pertain to the evaluation of patients at risk for COVID-19 are in a state of rapid change based on information released by regulatory bodies including the CDC and federal and state organizations. These policies  and algorithms were followed during the patient's care in the ED.  ____________________________________________   FINAL CLINICAL IMPRESSION(S) / ED DIAGNOSES  Urinary tract infection   11/05/2019, MD 11/03/19 2013

## 2019-11-03 NOTE — ED Triage Notes (Signed)
Pt comes POV from [facility] brought by daughter for complaints of painful urination, frequent urination, etc. Does get frequent UTIs. Pt does have dementia. Daughter with pt.

## 2019-11-03 NOTE — ED Notes (Signed)
No peripheral IV placed this visit.   Discharge instructions reviewed with patient. Questions fielded by this RN. Patient verbalizes understanding of instructions. Patient discharged home in stable condition per Dr Lenard Lance . No acute distress noted at time of discharge.   Pt wheeled to vehicle

## 2019-11-06 LAB — URINE CULTURE: Culture: >=100000 — AB

## 2020-05-24 ENCOUNTER — Emergency Department: Payer: Medicare Other

## 2020-05-24 ENCOUNTER — Inpatient Hospital Stay
Admission: EM | Admit: 2020-05-24 | Discharge: 2020-05-30 | DRG: 640 | Disposition: A | Discharge status: Skilled Nursing Facility | Payer: Medicare Other | Attending: Family Medicine | Admitting: Family Medicine

## 2020-05-24 ENCOUNTER — Other Ambulatory Visit: Payer: Self-pay

## 2020-05-24 DIAGNOSIS — K219 Gastro-esophageal reflux disease without esophagitis: Secondary | ICD-10-CM | POA: Diagnosis present

## 2020-05-24 DIAGNOSIS — Z66 Do not resuscitate: Secondary | ICD-10-CM | POA: Diagnosis present

## 2020-05-24 DIAGNOSIS — R627 Adult failure to thrive: Secondary | ICD-10-CM | POA: Diagnosis present

## 2020-05-24 DIAGNOSIS — B9620 Unspecified Escherichia coli [E. coli] as the cause of diseases classified elsewhere: Secondary | ICD-10-CM | POA: Diagnosis present

## 2020-05-24 DIAGNOSIS — Z974 Presence of external hearing-aid: Secondary | ICD-10-CM

## 2020-05-24 DIAGNOSIS — G4700 Insomnia, unspecified: Secondary | ICD-10-CM | POA: Diagnosis present

## 2020-05-24 DIAGNOSIS — F0391 Unspecified dementia with behavioral disturbance: Secondary | ICD-10-CM | POA: Diagnosis present

## 2020-05-24 DIAGNOSIS — R112 Nausea with vomiting, unspecified: Secondary | ICD-10-CM | POA: Diagnosis present

## 2020-05-24 DIAGNOSIS — Z9181 History of falling: Secondary | ICD-10-CM

## 2020-05-24 DIAGNOSIS — E872 Acidosis, unspecified: Secondary | ICD-10-CM | POA: Diagnosis present

## 2020-05-24 DIAGNOSIS — Z20822 Contact with and (suspected) exposure to covid-19: Secondary | ICD-10-CM | POA: Diagnosis present

## 2020-05-24 DIAGNOSIS — E860 Dehydration: Secondary | ICD-10-CM | POA: Diagnosis not present

## 2020-05-24 DIAGNOSIS — N179 Acute kidney failure, unspecified: Secondary | ICD-10-CM | POA: Diagnosis present

## 2020-05-24 DIAGNOSIS — Z79899 Other long term (current) drug therapy: Secondary | ICD-10-CM

## 2020-05-24 DIAGNOSIS — N390 Urinary tract infection, site not specified: Secondary | ICD-10-CM | POA: Diagnosis present

## 2020-05-24 DIAGNOSIS — G9341 Metabolic encephalopathy: Secondary | ICD-10-CM | POA: Diagnosis present

## 2020-05-24 DIAGNOSIS — R531 Weakness: Secondary | ICD-10-CM

## 2020-05-24 DIAGNOSIS — F32A Depression, unspecified: Secondary | ICD-10-CM | POA: Diagnosis present

## 2020-05-24 DIAGNOSIS — Z888 Allergy status to other drugs, medicaments and biological substances status: Secondary | ICD-10-CM

## 2020-05-24 DIAGNOSIS — I10 Essential (primary) hypertension: Secondary | ICD-10-CM | POA: Diagnosis present

## 2020-05-24 DIAGNOSIS — R7989 Other specified abnormal findings of blood chemistry: Secondary | ICD-10-CM

## 2020-05-24 DIAGNOSIS — Z1611 Resistance to penicillins: Secondary | ICD-10-CM | POA: Diagnosis present

## 2020-05-24 LAB — CBC
HCT: 41.7 % (ref 36.0–46.0)
Hemoglobin: 13.6 g/dL (ref 12.0–15.0)
MCH: 31.9 pg (ref 26.0–34.0)
MCHC: 32.6 g/dL (ref 30.0–36.0)
MCV: 97.9 fL (ref 80.0–100.0)
Platelets: 310 10*3/uL (ref 150–400)
RBC: 4.26 MIL/uL (ref 3.87–5.11)
RDW: 13.8 % (ref 11.5–15.5)
WBC: 11.6 10*3/uL — ABNORMAL HIGH (ref 4.0–10.5)
nRBC: 0 % (ref 0.0–0.2)

## 2020-05-24 LAB — COMPREHENSIVE METABOLIC PANEL
ALT: 26 U/L (ref 0–44)
AST: 27 U/L (ref 15–41)
Albumin: 4 g/dL (ref 3.5–5.0)
Alkaline Phosphatase: 56 U/L (ref 38–126)
Anion gap: 12 (ref 5–15)
BUN: 21 mg/dL (ref 8–23)
CO2: 22 mmol/L (ref 22–32)
Calcium: 9.6 mg/dL (ref 8.9–10.3)
Chloride: 103 mmol/L (ref 98–111)
Creatinine, Ser: 1.22 mg/dL — ABNORMAL HIGH (ref 0.44–1.00)
GFR, Estimated: 41 mL/min — ABNORMAL LOW (ref >60)
Glucose, Bld: 167 mg/dL — ABNORMAL HIGH (ref 70–99)
Potassium: 3.9 mmol/L (ref 3.5–5.1)
Sodium: 137 mmol/L (ref 135–145)
Total Bilirubin: 0.4 mg/dL (ref 0.3–1.2)
Total Protein: 6.9 g/dL (ref 6.5–8.1)

## 2020-05-24 LAB — LIPASE, BLOOD: Lipase: 28 U/L (ref 11–51)

## 2020-05-24 MED ORDER — IOHEXOL 300 MG/ML  SOLN
75.0000 mL | Freq: Once | INTRAMUSCULAR | Status: AC | PRN
  Administered 2020-05-24: 75 mL via INTRAVENOUS

## 2020-05-24 MED ORDER — SODIUM CHLORIDE 0.9 % IV SOLN
1.0000 g | Freq: Once | INTRAVENOUS | Status: AC
  Administered 2020-05-24: 1 g via INTRAVENOUS
  Filled 2020-05-24: qty 10

## 2020-05-24 MED ORDER — SODIUM CHLORIDE 0.9 % IV BOLUS
500.0000 mL | Freq: Once | INTRAVENOUS | Status: AC
  Administered 2020-05-24: 500 mL via INTRAVENOUS

## 2020-05-24 NOTE — ED Notes (Signed)
Dana Curtis, [phone], called and HIPAA compliant msg left

## 2020-05-24 NOTE — ED Notes (Signed)
Yellow socks placed on pt, additional blankets provided for comfort. Pt to CT at this time. Medicated per [facility]

## 2020-05-24 NOTE — ED Triage Notes (Signed)
Pt arrived via [facility] with c/o AMS. Per EMS, pt started antibiotic 2 days ago for a UTI, and per family has been increasingly altered since starting it. Per EMS, family states pt is having trouble using the L arm and leg x2days.   Per EMS, unable to do stroke screen as pt is unable to follow commands.   Per EMS, pt mobility baseline is up OOB with x1 assistance, unable to do so at this time.   Per EMS, VS 113/57, 93% RA, HR 76, temp 98.58F, ETCO2 14-20, CBG 186.

## 2020-05-24 NOTE — ED Notes (Signed)
Assumed care of pt upon being roomed. Hx of dementia, oriented only to name. Complaining of central abdominal pain. Per family pt has been constipated, prescribed stool softeners without relief. Reports dx of UTI x2 days with use of abx x2 days. States since use of abx pt has been unable to bear weight to bilateral legs and family has "dropped" pt on buttock in attempt to help ambulate/reposition her. Per family pt has also not used her LUE, upon assessment pt barely able to hold arm to gravity, moderate to weak grip strength. Unable to hold bilateral lower extremities against gravity upon initial assessment. Denies head strike or head trauma recently. Pharmacy verified home meds with daughter at bedside. Pt reports mild nausea, no vomiting noted. Blankets provided for comfort. Breathing is regular and unlabored

## 2020-05-25 DIAGNOSIS — K219 Gastro-esophageal reflux disease without esophagitis: Secondary | ICD-10-CM | POA: Diagnosis present

## 2020-05-25 DIAGNOSIS — F32A Depression, unspecified: Secondary | ICD-10-CM | POA: Diagnosis present

## 2020-05-25 DIAGNOSIS — R112 Nausea with vomiting, unspecified: Secondary | ICD-10-CM

## 2020-05-25 DIAGNOSIS — G4700 Insomnia, unspecified: Secondary | ICD-10-CM | POA: Diagnosis present

## 2020-05-25 DIAGNOSIS — F0391 Unspecified dementia with behavioral disturbance: Secondary | ICD-10-CM | POA: Diagnosis present

## 2020-05-25 DIAGNOSIS — E872 Acidosis, unspecified: Secondary | ICD-10-CM | POA: Diagnosis present

## 2020-05-25 DIAGNOSIS — R627 Adult failure to thrive: Secondary | ICD-10-CM | POA: Diagnosis present

## 2020-05-25 DIAGNOSIS — Z888 Allergy status to other drugs, medicaments and biological substances status: Secondary | ICD-10-CM | POA: Diagnosis not present

## 2020-05-25 DIAGNOSIS — Z974 Presence of external hearing-aid: Secondary | ICD-10-CM | POA: Diagnosis not present

## 2020-05-25 DIAGNOSIS — Z9181 History of falling: Secondary | ICD-10-CM | POA: Diagnosis not present

## 2020-05-25 DIAGNOSIS — Z66 Do not resuscitate: Secondary | ICD-10-CM | POA: Diagnosis present

## 2020-05-25 DIAGNOSIS — B9620 Unspecified Escherichia coli [E. coli] as the cause of diseases classified elsewhere: Secondary | ICD-10-CM | POA: Diagnosis present

## 2020-05-25 DIAGNOSIS — Z79899 Other long term (current) drug therapy: Secondary | ICD-10-CM | POA: Diagnosis not present

## 2020-05-25 DIAGNOSIS — R7989 Other specified abnormal findings of blood chemistry: Secondary | ICD-10-CM

## 2020-05-25 DIAGNOSIS — N179 Acute kidney failure, unspecified: Secondary | ICD-10-CM | POA: Diagnosis present

## 2020-05-25 DIAGNOSIS — Z20822 Contact with and (suspected) exposure to covid-19: Secondary | ICD-10-CM | POA: Diagnosis present

## 2020-05-25 DIAGNOSIS — Z1611 Resistance to penicillins: Secondary | ICD-10-CM | POA: Diagnosis present

## 2020-05-25 DIAGNOSIS — E860 Dehydration: Principal | ICD-10-CM | POA: Diagnosis present

## 2020-05-25 DIAGNOSIS — R531 Weakness: Secondary | ICD-10-CM

## 2020-05-25 DIAGNOSIS — N390 Urinary tract infection, site not specified: Secondary | ICD-10-CM | POA: Diagnosis present

## 2020-05-25 DIAGNOSIS — G9341 Metabolic encephalopathy: Secondary | ICD-10-CM | POA: Diagnosis present

## 2020-05-25 DIAGNOSIS — I10 Essential (primary) hypertension: Secondary | ICD-10-CM | POA: Diagnosis present

## 2020-05-25 LAB — BASIC METABOLIC PANEL
Anion gap: 7 (ref 5–15)
BUN: 17 mg/dL (ref 8–23)
CO2: 25 mmol/L (ref 22–32)
Calcium: 9 mg/dL (ref 8.9–10.3)
Chloride: 108 mmol/L (ref 98–111)
Creatinine, Ser: 1.03 mg/dL — ABNORMAL HIGH (ref 0.44–1.00)
GFR, Estimated: 50 mL/min — ABNORMAL LOW (ref >60)
Glucose, Bld: 100 mg/dL — ABNORMAL HIGH (ref 70–99)
Potassium: 3.7 mmol/L (ref 3.5–5.1)
Sodium: 140 mmol/L (ref 135–145)

## 2020-05-25 LAB — URINALYSIS, COMPLETE (UACMP) WITH MICROSCOPIC
Bacteria, UA: NONE SEEN
Bilirubin Urine: NEGATIVE
Glucose, UA: NEGATIVE mg/dL
Hgb urine dipstick: NEGATIVE
Ketones, ur: NEGATIVE mg/dL
Leukocytes,Ua: NEGATIVE
Nitrite: NEGATIVE
Protein, ur: NEGATIVE mg/dL
Specific Gravity, Urine: >1.046 — ABNORMAL HIGH (ref 1.005–1.030)
pH: 6 (ref 5.0–8.0)

## 2020-05-25 LAB — CBC
HCT: 40.3 % (ref 36.0–46.0)
Hemoglobin: 13 g/dL (ref 12.0–15.0)
MCH: 31.3 pg (ref 26.0–34.0)
MCHC: 32.3 g/dL (ref 30.0–36.0)
MCV: 97.1 fL (ref 80.0–100.0)
Platelets: 294 10*3/uL (ref 150–400)
RBC: 4.15 MIL/uL (ref 3.87–5.11)
RDW: 13.8 % (ref 11.5–15.5)
WBC: 10.7 10*3/uL — ABNORMAL HIGH (ref 4.0–10.5)
nRBC: 0 % (ref 0.0–0.2)

## 2020-05-25 LAB — SARS CORONAVIRUS 2 (TAT 6-24 HRS): SARS Coronavirus 2: NEGATIVE

## 2020-05-25 LAB — LACTIC ACID, PLASMA
Lactic Acid, Venous: 1.7 mmol/L (ref 0.5–1.9)
Lactic Acid, Venous: 2.9 mmol/L (ref 0.5–1.9)

## 2020-05-25 MED ORDER — HALOPERIDOL LACTATE 5 MG/ML IJ SOLN
2.0000 mg | Freq: Once | INTRAMUSCULAR | Status: AC
  Administered 2020-05-25: 16:00:00 2 mg via INTRAVENOUS
  Filled 2020-05-25: qty 1

## 2020-05-25 MED ORDER — ONDANSETRON HCL 4 MG/2ML IJ SOLN: 4.0000 mg | Freq: Four times a day (QID) | INTRAMUSCULAR | Status: DC | PRN

## 2020-05-25 MED ORDER — ESCITALOPRAM OXALATE 10 MG PO TABS
5.0000 mg | ORAL_TABLET | Freq: Every day | ORAL | Status: DC
  Administered 2020-05-27 – 2020-05-29 (×3): 5 mg via ORAL
  Filled 2020-05-25 (×6): qty 0.5

## 2020-05-25 MED ORDER — ONDANSETRON 4 MG PO TBDP: 4.0000 mg | ORAL_TABLET | Freq: Three times a day (TID) | ORAL | Status: DC | PRN

## 2020-05-25 MED ORDER — PANTOPRAZOLE SODIUM 40 MG PO TBEC
40.0000 mg | DELAYED_RELEASE_TABLET | Freq: Every day | ORAL | Status: DC
  Administered 2020-05-27 – 2020-05-29 (×3): 40 mg via ORAL
  Filled 2020-05-25 (×6): qty 1

## 2020-05-25 MED ORDER — MAGNESIUM HYDROXIDE 400 MG/5ML PO SUSP
30.0000 mL | Freq: Every day | ORAL | Status: DC | PRN
  Filled 2020-05-25: qty 30

## 2020-05-25 MED ORDER — DICLOFENAC SODIUM 1 % EX GEL
2.0000 g | Freq: Two times a day (BID) | CUTANEOUS | Status: DC
  Administered 2020-05-26 – 2020-05-29 (×4): 2 g via TOPICAL
  Filled 2020-05-25: qty 100

## 2020-05-25 MED ORDER — SODIUM CHLORIDE 0.9 % IV SOLN
1.0000 g | INTRAVENOUS | Status: AC
  Administered 2020-05-25 – 2020-05-26 (×2): 1 g via INTRAVENOUS
  Filled 2020-05-25 (×2): qty 10

## 2020-05-25 MED ORDER — TRAZODONE HCL 50 MG PO TABS: 25.0000 mg | ORAL_TABLET | Freq: Every evening | ORAL | Status: DC | PRN

## 2020-05-25 MED ORDER — HALOPERIDOL LACTATE 5 MG/ML IJ SOLN
2.0000 mg | Freq: Four times a day (QID) | INTRAMUSCULAR | Status: DC | PRN
  Administered 2020-05-25 – 2020-05-26 (×3): 2 mg via INTRAVENOUS
  Filled 2020-05-25 (×2): qty 1

## 2020-05-25 MED ORDER — MELATONIN 5 MG PO TABS
2.5000 mg | ORAL_TABLET | Freq: Every day | ORAL | Status: DC
  Filled 2020-05-25 (×4): qty 0.5

## 2020-05-25 MED ORDER — ESTRADIOL 0.1 MG/GM VA CREA
1.0000 | TOPICAL_CREAM | Freq: Every day | VAGINAL | Status: DC
  Administered 2020-05-27 – 2020-05-29 (×3): 1 via VAGINAL
  Filled 2020-05-25 (×2): qty 42.5

## 2020-05-25 MED ORDER — DULOXETINE HCL 30 MG PO CPEP
30.0000 mg | ORAL_CAPSULE | Freq: Every day | ORAL | Status: DC
  Administered 2020-05-27 – 2020-05-29 (×3): 30 mg via ORAL
  Filled 2020-05-25 (×6): qty 1

## 2020-05-25 MED ORDER — AMLODIPINE BESYLATE 5 MG PO TABS
5.0000 mg | ORAL_TABLET | Freq: Every day | ORAL | Status: DC
  Administered 2020-05-27 – 2020-05-29 (×3): 5 mg via ORAL
  Filled 2020-05-25 (×5): qty 1

## 2020-05-25 MED ORDER — SODIUM CHLORIDE 0.9 % IV SOLN: INTRAVENOUS | Status: DC

## 2020-05-25 MED ORDER — ACETAMINOPHEN 325 MG PO TABS
650.0000 mg | ORAL_TABLET | Freq: Four times a day (QID) | ORAL | Status: DC | PRN
  Filled 2020-05-25 (×2): qty 2

## 2020-05-25 MED ORDER — CRANBERRY 450 MG PO TABS: 450.0000 mg | ORAL_TABLET | Freq: Every day | ORAL | Status: DC

## 2020-05-25 MED ORDER — SENNA 8.6 MG PO TABS: 2.0000 | ORAL_TABLET | Freq: Every day | ORAL | Status: DC | PRN

## 2020-05-25 MED ORDER — SULFAMETHOXAZOLE-TRIMETHOPRIM 800-160 MG PO TABS
1.0000 | ORAL_TABLET | Freq: Two times a day (BID) | ORAL | Status: DC
  Filled 2020-05-25 (×3): qty 1

## 2020-05-25 MED ORDER — ENOXAPARIN SODIUM 30 MG/0.3ML ~~LOC~~ SOLN
30.0000 mg | SUBCUTANEOUS | Status: DC
  Administered 2020-05-26: 30 mg via SUBCUTANEOUS
  Filled 2020-05-25 (×2): qty 0.3

## 2020-05-25 MED ORDER — LORAZEPAM 2 MG/ML IJ SOLN
0.5000 mg | Freq: Once | INTRAMUSCULAR | Status: AC
  Administered 2020-05-25: 0.5 mg via INTRAVENOUS
  Filled 2020-05-25: qty 1

## 2020-05-25 MED ORDER — CALCIUM CARBONATE-VITAMIN D 500-200 MG-UNIT PO TABS
1.0000 | ORAL_TABLET | Freq: Every day | ORAL | Status: DC
  Administered 2020-05-27 – 2020-05-29 (×3): 1 via ORAL
  Filled 2020-05-25 (×4): qty 1

## 2020-05-25 MED ORDER — ONDANSETRON HCL 4 MG PO TABS: 4.0000 mg | ORAL_TABLET | Freq: Four times a day (QID) | ORAL | Status: DC | PRN

## 2020-05-25 MED ORDER — ACETAMINOPHEN 650 MG RE SUPP: 650.0000 mg | Freq: Four times a day (QID) | RECTAL | Status: DC | PRN

## 2020-05-25 MED ORDER — TRAZODONE HCL 50 MG PO TABS
50.0000 mg | ORAL_TABLET | Freq: Every day | ORAL | Status: DC
  Filled 2020-05-25: qty 1

## 2020-05-25 NOTE — ED Notes (Signed)
Pt lactic 2.9 mmol/L, MD Quale aware via secure chat at this time

## 2020-05-25 NOTE — ED Notes (Signed)
MD Quale at bedside for pt update, awaiting hospitalist consultation

## 2020-05-25 NOTE — ED Provider Notes (Signed)
[facility] Emergency Department Provider Note   ____________________________________________   Event Date/Time   First MD Initiated Contact with Patient 05/24/20 2304     (approximate)  I have reviewed the triage vital signs and the nursing notes.   HISTORY  Chief Complaint Altered Mental Status   EM caveat: Dementia, alteration mental status  HPI Dana Curtis is a 87 y.o. female   here with family who provide majority history.  Recent visit to [facility] geriatrics for concerns of weakness as well as nausea and vomiting for the last 4 or so days.  That she was diagnosed urinary tract infection started on Zofran and an antibiotic which was later changed 2 days ago to Bactrim which she has been taking.  Parent reports has been very tired, has had some type of a progressive increasing weakness over her left side for several months now according to daughter and it seems a little more prominent the last 2 days as well involving the left arm and left leg.  She has been more tired, slightly more confused, and today stayed in bed all day and required full assistance to even get up.  This is not typical  She is also been complaining of ongoing abdominal pain.  No fevers noted.  No chest pain no shortness of breath.  Negative Covid test recently at [facility].  Patient is able to identify her family, and tell me that she is having abdominal pain but cannot describe it further.  She reports to be mild but all over  Past Medical History:  Diagnosis Date  . Chronic GERD   . Dementia (HCC)   . Hypertension   . Renal disorder     There are no problems to display for this patient.   Past Surgical History:  Procedure Laterality Date  . TONSILLECTOMY      Prior to Admission medications   Medication Sig Start Date End Date Taking? Authorizing Provider  acetaminophen (TYLENOL) 500 MG tablet Take 1,000 mg by mouth in the morning and at bedtime.   Yes [provider]   amLODipine (NORVASC) 5 MG tablet Take 5 mg by mouth at bedtime.    Yes [provider]  Calcium Carbonate-Vitamin D 600-400 MG-UNIT tablet Take 1 tablet by mouth at bedtime.   Yes [provider]  Cranberry 450 MG TABS Take 450 mg by mouth at bedtime.    Yes [provider]  diclofenac Sodium (VOLTAREN) 1 % GEL Apply 2 g topically in the morning and at bedtime. To right shoulder   Yes [provider]  DULoxetine (CYMBALTA) 30 MG capsule Take 30 mg by mouth daily.   Yes [provider]  escitalopram (LEXAPRO) 5 MG tablet Take 5 mg by mouth daily.   Yes [provider]  losartan (COZAAR) 100 MG tablet Take 100 mg by mouth at bedtime.    Yes [provider]  MELATONIN PO Take 3 mg by mouth at bedtime.   Yes [provider]  Menthol, Topical Analgesic, (BIOFREEZE EX) Apply topically 3 (three) times daily as needed (pain). Apply to feet, shoulders, or other muscles and joints   Yes [provider]  ondansetron (ZOFRAN-ODT) 4 MG disintegrating tablet Take 4 mg by mouth every 8 (eight) hours as needed for nausea or vomiting.   Yes [provider]  selenium sulfide (SELSUN) 1 % LOTN Apply 1 application topically daily.   Yes [provider]  senna (SENOKOT) 8.6 MG TABS tablet Take  2 tablets by mouth daily as needed for mild constipation.   Yes [provider]  sulfamethoxazole-trimethoprim (BACTRIM DS) 800-160 MG tablet Take 1 tablet by mouth 2 (two) times daily.   Yes [provider]  traZODone (DESYREL) 50 MG tablet Take 50 mg by mouth at bedtime.   Yes [provider]  clotrimazole (LOTRIMIN) 1 % cream Apply 1 application topically 2 (two) times daily.    [provider]  esomeprazole (NEXIUM) 40 MG capsule Take 40 mg by mouth daily at 12 noon.    [provider]  estradiol (ESTRACE) 0.1 MG/GM vaginal cream Place 1 Applicatorful vaginally at bedtime.     [provider]    Allergies Lisinopril and Macrobid [nitrofurantoin]  No family history on file.  Social History Social History   Tobacco Use  . Smoking status: Never Smoker  . Smokeless tobacco: Never Used  Vaping Use  . Vaping Use: Never used  Substance Use Topics  . Alcohol use: No  . Drug use: No    Review of Systems Constitutional: No fever/chills Eyes: No visual changes. ENT: No sore throat. Cardiovascular: Denies chest pain. Respiratory: Denies shortness of breath. Gastrointestinal: No abdominal pain.   Genitourinary: Negative for dysuria. Musculoskeletal: Negative for back pain. Skin: Negative for rash. Neurological: Negative for headaches, areas of focal weakness or numbness.    ____________________________________________   PHYSICAL EXAM:  VITAL SIGNS: ED Triage Vitals  Enc Vitals Group     BP 05/24/20 2231 (!) 131/48     Pulse Rate 05/24/20 2231 74     Resp 05/24/20 2231 12     Temp 05/24/20 2231 97.8 F (36.6 C)     Temp Source 05/24/20 2231 Oral     SpO2 05/24/20 2231 98 %     Weight 05/24/20 2232 145 lb (65.8 kg)     Height 05/24/20 2232 4\' 10"  (1.473 m)     Head Circumference --      Peak Flow --      Pain Score --      Pain Loc --      Pain Edu? --      Excl. in GC? --     Constitutional: Alert and oriented to family but not to situation or location. Well appearing and in no acute distress.  Somewhat pleasantly confused, slightly somnolent.  Preferring to rest but interacts appropriately.  Hard of hearing.  Has to read lips, for which I have to speak to her from across the room with my mask down for her to understand Eyes: Conjunctivae are normal. Head: Atraumatic. Nose: No congestion/rhinnorhea. Mouth/Throat: Mucous membranes are slightly dry. Neck: No stridor.  Cardiovascular: Normal rate, regular rhythm. Grossly normal heart sounds.  Good peripheral circulation. Respiratory: Normal respiratory effort.  No retractions.  Lungs CTAB. Gastrointestinal: Soft and reports tenderness diffusely to exam.  She has a small umbilical or periumbilical hernia but soft and reducible.  She does however report tenderness to palpation and some to percussion throughout on exam.  Again however there is no focality and she does not demonstrate what appears to be an obvious acute abdomen. Musculoskeletal: No lower extremity tenderness nor edema. Neurologic:  Normal speech and language. No gross focal neurologic deficits are appreciated except she is moderately weak in her left upper extremity compared to the right and on the left lower extremity she demonstrates ability to wiggle her toes only while able to lift the right foot up just slightly.  However family the reports  that this seems to be a slowly progressing issue and not a sudden issue they have noticed weakness on the left side now for quite some time, but it does seem worse the last 2 days.  Skin:  Skin is warm, dry and intact. No rash noted. Psychiatric: Mood and affect are calm.  Disoriented and pleasant. ____________________________________________   LABS (all labs ordered are listed, but only abnormal results are displayed)  Labs Reviewed  CBC - Abnormal; Notable for the following components:      Result Value   WBC 11.6 (*)    All other components within normal limits  COMPREHENSIVE METABOLIC PANEL - Abnormal; Notable for the following components:   Glucose, Bld 167 (*)    Creatinine, Ser 1.22 (*)    GFR, Estimated 41 (*)    All other components within normal limits  URINALYSIS, COMPLETE (UACMP) WITH MICROSCOPIC - Abnormal; Notable for the following components:   Color, Urine YELLOW (*)    APPearance CLEAR (*)    Specific Gravity, Urine >1.046 (*)    All other components within normal limits  LACTIC ACID, PLASMA - Abnormal; Notable for the following components:   Lactic Acid, Venous 2.9 (*)    All other components within normal limits  CULTURE, BLOOD (SINGLE)   SARS CORONAVIRUS 2 (TAT 6-24 HRS)  LIPASE, BLOOD  LACTIC ACID, PLASMA   ____________________________________________  EKG  Reviewed inter by me at 0024 Heart rate 75 QRS 100 QTc 480 Normal sinus rhythm, no evidence of acute ischemia or ectopy. ____________________________________________  RADIOLOGY  CT Head Wo Contrast  Result Date: 05/25/2020 CLINICAL DATA:  Encephalopathy EXAM: CT HEAD WITHOUT CONTRAST TECHNIQUE: Contiguous axial images were obtained from the base of the skull through the vertex without intravenous contrast. COMPARISON:  03/02/2018 FINDINGS: Brain: There is no mass, hemorrhage or extra-axial collection. There is generalized atrophy without lobar predilection. Hypodensity of the white matter is most commonly associated with chronic microvascular disease. Vascular: Atherosclerotic calcification of the vertebral and internal carotid arteries at the skull base. No abnormal hyperdensity of the major intracranial arteries or dural venous sinuses. Skull: The visualized skull base, calvarium and extracranial soft tissues are normal. Sinuses/Orbits: No fluid levels or advanced mucosal thickening of the visualized paranasal sinuses. No mastoid or middle ear effusion. The orbits are normal. IMPRESSION: Generalized atrophy and chronic microvascular ischemia without acute intracranial abnormality. Electronically Signed   By: Deatra Robinson M.D.   On: 05/25/2020 02:08   CT ABDOMEN PELVIS W CONTRAST  Result Date: 05/25/2020 CLINICAL DATA:  Altered mental status. EXAM: CT ABDOMEN AND PELVIS WITH CONTRAST TECHNIQUE: Multidetector CT imaging of the abdomen and pelvis was performed using the standard protocol following bolus administration of intravenous contrast. CONTRAST:  46mL OMNIPAQUE IOHEXOL 300 MG/ML  SOLN COMPARISON:  May 10, 2019 and July 28, 2016 FINDINGS: Lower chest: A stable, benign 7 mm noncalcified lung nodule is seen within the left lung base. Hepatobiliary: A stable 3.3 cm  x 3.3 cm low-attenuation liver lesion is seen within the left lobe. No gallstones, gallbladder wall thickening, or biliary dilatation. Pancreas: Unremarkable. No pancreatic ductal dilatation or surrounding inflammatory changes. Spleen: Normal in size without focal abnormality. Adrenals/Urinary Tract: Adrenal glands are unremarkable. Kidneys are normal, without renal calculi or hydronephrosis. A small, stable renal cysts are seen within the left kidney. Bladder is unremarkable. Stomach/Bowel: There is a large gastric hernia. Appendix appears normal. No evidence of bowel wall thickening, distention, or inflammatory changes. Noninflamed diverticula are seen throughout the sigmoid  colon. Additional small bowel diverticula are seen within the mid to upper left abdomen (axial CT images 28 through 35, CT series number 2). These are increased in size when compared to the prior exam. Vascular/Lymphatic: Aortic atherosclerosis. No enlarged abdominal or pelvic lymph nodes. Reproductive: Uterus and bilateral adnexa are unremarkable. Other: No abdominal wall hernia or abnormality. No abdominopelvic ascites. Musculoskeletal: Multilevel degenerative changes seen throughout the lumbar spine. IMPRESSION: 1. Large, stable gastric hernia. 2. Sigmoid and small bowel diverticulosis. 3. Stable hepatic cyst. 4. Aortic atherosclerosis. Aortic Atherosclerosis (ICD10-I70.0). Electronically Signed   By: Aram Candela M.D.   On: 05/25/2020 02:32   DG Chest Portable 1 View  Result Date: 05/25/2020 CLINICAL DATA:  Weakness EXAM: PORTABLE CHEST 1 VIEW COMPARISON:  None. FINDINGS: Mild cardiomegaly. Bibasilar atelectasis. No pneumothorax or sizable pleural effusion. IMPRESSION: Mild cardiomegaly and bibasilar atelectasis. Electronically Signed   By: Deatra Robinson M.D.   On: 05/25/2020 00:09    CT the head negative for acute finding.  CT abdomen pelvis also negative for acute findings.  See as above.  Chest x-ray mild  cardiomegaly. ____________________________________________   PROCEDURES  Procedure(s) performed: None  Procedures  Critical Care performed: No  ____________________________________________   INITIAL IMPRESSION / ASSESSMENT AND PLAN / ED COURSE  Pertinent labs & imaging results that were available during my care of the patient were reviewed by me and considered in my medical decision making (see chart for details).   Patient recently diagnosed with urinary tract infection, being treated but having progressive increasing weakness decreased oral intake, also some apparent increasing weakness over the left side, though this is seemingly a somewhat chronic neurologic concern.  Additionally the patient now no longer able to ambulate independently, 2 for fatigue to be up and about without significant assistance by family.  Seems to be slowly declining over the last several days despite treatment.  Urine culture from [facility] reviewed, Bactrim should be effective therapy also sensitive to Rocephin we will give IV Rocephin here.  Clinical Course as of 05/25/20 2505  Wynelle Link May 25, 2020  0023 Labs are notable for a mild leukocytosis, mild AKI, and elevated lactic acid. [MQ]    Clinical Course User Index [MQ] Sharyn Creamer, MD    ----------------------------------------- 3:14 AM on 05/25/2020 -----------------------------------------  Patient more alert, still to for fatigue to get herself up about or sit up in bed by herself.  Discussed with family including daughter at the bedside, will admit for further care and management especially given her worsening status despite treatment for urinary tract infection.  Appears to be dehydrated, mildly elevated lactic acid, suspect improving UTI, but will continue on Rocephin at this time further work-up per the hospitalist service.  Family including daughter at bedside agreeable with plan for admission. Discussed with Dr.  Arville Care ____________________________________________   FINAL CLINICAL IMPRESSION(S) / ED DIAGNOSES  Final diagnoses:  AKI (acute kidney injury) (HCC)  Dehydration, mild  Elevated lactic acid level  Lower urinary tract infection, acute        Note:  This document was prepared using Dragon voice recognition software and may include unintentional dictation errors       Sharyn Creamer, MD 05/25/20 [phone]

## 2020-05-25 NOTE — Progress Notes (Signed)
  PROGRESS NOTE    Dana Curtis  IRS:[id number] DOB: January 09, 1922 DOA: 05/24/2020  PCP: Lollie Marrow, MD    LOS - 0    Patient admitted earlier this AM with signs of dehydration, AKI, likely UTI.    Interval subjective: Per nursing, pt refused all meds this morning including her antibiotic.  Patient agitated at times.  Spoke with daughter Delaney Meigs this afternoon, she states pt will often try to refuse meds and they sometimes have to 'bribe her'.    Exam: frail, awake and alert, confused, no gross focal neuro deficits, pt does not want me to listen to her chest, normal respiratory effort with no sign of distress   Active Problems:   AKI (acute kidney injury) (HCC)   Nausea & vomiting   Lactic acidosis   Essential hypertension   GERD (gastroesophageal reflux disease)   Depression   Dehydration   Generalized weakness    I have reviewed the full H&P by Dr. Arville Care in detail, and I agree with the assessment and plan as outlined therein. In addition:  Dementia with behavioral disturbances - pt became comabative and increasingly agitated.  This afternoon required IV medication for safety of patient and staff as she was kicking and swinging at staff providing care. --No benefit with Ativan, would avoid benzos --Better result with low dose Haldol --IV Haldol 2mg  q6h PRN agitation  ----Delirium precautions:     -Lights and TV off, minimize interruptions at night    -Blinds open and lights on during day    -Glasses/hearing aid with patient    -Frequent reorientation    -PT/OT when able    -Avoid sedation medications/Beers list medications    Chart Reviewed:  PCP visit on 05/20/20 for generalized abdominal pain and nausea.   UA suspicious for UTI and urine culture was growing E. coli. Resistant to ampicillin, amp/sulbactam, intermediate to cefazolin, otherwise susceptible including to Bactrim.  She was initially started on Keflex,  Medical History: . Dementia (CMS-HCC)  . HTN  (hypertension)  . GERD (gastroesophageal reflux disease)  . IBS (irritable bowel syndrome)  . Depression  . Wears hearing aid  . Age-related osteoporosis without current pathological fracture  . Risk for falls  . Primary osteoarthritis involving multiple joints  . Small intestinal bacterial overgrowth  . Falls, sequela  . Right hip pain  . Overactive bladder      Time spent:  30 minutes spent total including in coordination of care and at bedside   07/20/20, DO [facility]   If 7PM-7AM, please contact night-coverage [email or web address] 05/25/2020, 9:06 PM

## 2020-05-25 NOTE — Progress Notes (Signed)
PHARMACIST - PHYSICIAN COMMUNICATION  CONCERNING:  Enoxaparin (Lovenox) for DVT Prophylaxis    RECOMMENDATION: Patient was prescribed enoxaprin 40mg  q24 hours for VTE prophylaxis.   Filed Weights   05/24/20 2232  Weight: 65.8 kg (145 lb)    Body mass index is 30.31 kg/m.  Estimated Creatinine Clearance: 21.7 mL/min (A) (by C-G formula based on SCr of 1.22 mg/dL (H)).   Patient is candidate for enoxaparin 30mg  every 24 hours based on CrCl <81ml/min or Weight <45kg  DESCRIPTION: Pharmacy has adjusted enoxaparin dose per [facility] policy.  Patient is now receiving enoxaparin 30 mg every 24 hours   31m, PharmD, [facility] 05/25/2020 4:34 AM

## 2020-05-25 NOTE — Plan of Care (Signed)
  Problem: Education: Goal: Knowledge of General Education information will improve Description Including pain rating scale, medication(s)/side effects and non-pharmacologic comfort measures Outcome: Progressing   

## 2020-05-25 NOTE — H&P (Addendum)
[place]   PATIENT NAME: Dana Curtis    MR#:  [id number]  DATE OF BIRTH:  May 06, 1921  DATE OF ADMISSION:  05/24/2020  PRIMARY CARE PHYSICIAN: Lollie Marrow, MD   Patient is coming from: Home.  REQUESTING/REFERRING PHYSICIAN: Sharyn Creamer, MD  CHIEF COMPLAINT:   Chief Complaint  Patient presents with  . Altered Mental Status    HISTORY OF PRESENT ILLNESS:  Dana Curtis is a 87 y.o. female with medical history significant for hypertension, dementia and GERD, who presented to the emergency room with acute onset of recurrent nausea with occasional vomiting over the last 5 to 6 days.  She was recently diagnosed with E. coli UTI 5 days ago.  She was started on antibiotic therapy that was later switched to p.o. Bactrim.  She has been progressively getting weak and was thought to be dehydrated.  She has been having difficulty with her ADLs and recently having difficulty with ambulation.  No reported fever or chills.  No reported nausea or vomiting or abdominal pain.  No reported chest pain palpitations.  The patient is a very poor historian due to her advanced dementia.  ED Course: Upon presentation to the emergency room, blood pressure was 131/48 with otherwise normal vital signs.  CMP showed a creatinine of 1.22 up from 0.68.  Lactic acid was 2.9 and later 1.7.  CBC showed mild leukocytosis of 11.6.  Urinalysis shows specific gravity more than 1046, 0-5 WBCs and no bacteria. EKG as reviewed by me : Showed normal sinus rhythm with a rate of 71 with borderline right axis deviation and low voltage QRS. Imaging: Chest x-ray showed mild cardiomegaly with bibasilar atelectasis.  The patient was given IV Rocephin and 500 mill of IV normal saline.  She will be admitted to a medical bed for further evaluation and management. PAST MEDICAL HISTORY:   Past Medical History:  Diagnosis Date  . Chronic GERD   . Dementia (HCC)   . Hypertension   . Renal disorder     PAST SURGICAL  HISTORY:   Past Surgical History:  Procedure Laterality Date  . TONSILLECTOMY      SOCIAL HISTORY:   Social History   Tobacco Use  . Smoking status: Never Smoker  . Smokeless tobacco: Never Used  Substance Use Topics  . Alcohol use: No    FAMILY HISTORY:  No family history on file. Family history is unobtainable due to the patient's advanced dementia. DRUG ALLERGIES:   Allergies  Allergen Reactions  . Lisinopril   . Macrobid [Nitrofurantoin]     REVIEW OF SYSTEMS:   ROS As per history of present illness. All pertinent systems were reviewed above. Constitutional, HEENT, cardiovascular, respiratory, GI, GU, musculoskeletal, neuro, psychiatric, endocrine, integumentary and hematologic systems were reviewed and are otherwise negative/unremarkable except for positive findings mentioned above in the HPI.   MEDICATIONS AT HOME:   Prior to Admission medications   Medication Sig Start Date End Date Taking? Authorizing Provider  acetaminophen (TYLENOL) 500 MG tablet Take 1,000 mg by mouth in the morning and at bedtime.   Yes [provider]  amLODipine (NORVASC) 5 MG tablet Take 5 mg by mouth at bedtime.    Yes [provider]  Calcium Carbonate-Vitamin D 600-400 MG-UNIT tablet Take 1 tablet by mouth at bedtime.   Yes [provider]  Cranberry 450 MG TABS Take 450 mg by mouth at bedtime.    Yes [provider]  diclofenac Sodium (VOLTAREN) 1 %  GEL Apply 2 g topically in the morning and at bedtime. To right shoulder   Yes [provider]  DULoxetine (CYMBALTA) 30 MG capsule Take 30 mg by mouth daily.   Yes [provider]  escitalopram (LEXAPRO) 5 MG tablet Take 5 mg by mouth daily.   Yes [provider]  losartan (COZAAR) 100 MG tablet Take 100 mg by mouth at bedtime.    Yes [provider]  MELATONIN PO Take 3 mg by mouth at bedtime.   Yes [provider]  Menthol, Topical Analgesic, (BIOFREEZE  EX) Apply topically 3 (three) times daily as needed (pain). Apply to feet, shoulders, or other muscles and joints   Yes [provider]  ondansetron (ZOFRAN-ODT) 4 MG disintegrating tablet Take 4 mg by mouth every 8 (eight) hours as needed for nausea or vomiting.   Yes [provider]  selenium sulfide (SELSUN) 1 % LOTN Apply 1 application topically daily.   Yes [provider]  senna (SENOKOT) 8.6 MG TABS tablet Take 2 tablets by mouth daily as needed for mild constipation.   Yes [provider]  sulfamethoxazole-trimethoprim (BACTRIM DS) 800-160 MG tablet Take 1 tablet by mouth 2 (two) times daily.   Yes [provider]  traZODone (DESYREL) 50 MG tablet Take 50 mg by mouth at bedtime.   Yes [provider]  clotrimazole (LOTRIMIN) 1 % cream Apply 1 application topically 2 (two) times daily.    [provider]  esomeprazole (NEXIUM) 40 MG capsule Take 40 mg by mouth daily at 12 noon.    [provider]  estradiol (ESTRACE) 0.1 MG/GM vaginal cream Place 1 Applicatorful vaginally at bedtime.    [provider]      VITAL SIGNS:  Blood pressure (!) 148/50, pulse 83, temperature 97.8 F (36.6 C), temperature source Oral, resp. rate 20, height 4\' 10"  (1.473 m), weight 65.8 kg, SpO2 96 %.  PHYSICAL EXAMINATION:  Physical Exam  GENERAL:  87 y.o.-year-old female patient lying in the bed with no acute distress.  EYES: Pupils equal, round, reactive to light and accommodation. No scleral icterus. Extraocular muscles intact.  HEENT: Head atraumatic, normocephalic. Oropharynx and nasopharynx clear.  NECK:  Supple, no jugular venous distention. No thyroid enlargement, no tenderness.  LUNGS: Normal breath sounds bilaterally, no wheezing, rales,rhonchi or crepitation. No use of accessory muscles of respiration.  CARDIOVASCULAR: Regular rate and rhythm, S1, S2 normal. No murmurs, rubs, or gallops.  ABDOMEN: Soft, nondistended,  with mild suprapubic tenderness without rebound tenderness guarding or rigidity.. Bowel sounds present. No organomegaly but she had a palpable reducible periumbilical small hernia. EXTREMITIES: No pedal edema, cyanosis, or clubbing.  NEUROLOGIC: Cranial nerves II through XII are intact. Muscle strength 5/5 in all extremities. Sensation intact. Gait not checked.  PSYCHIATRIC: The patient is alert and oriented only to her name.  Normal affect and good eye contact. SKIN: No obvious rash, lesion, or ulcer.   LABORATORY PANEL:   CBC Recent Labs  Lab 05/24/20 2314  WBC 11.6*  HGB 13.6  HCT 41.7  PLT 310   ------------------------------------------------------------------------------------------------------------------  Chemistries  Recent Labs  Lab 05/24/20 2314  NA 137  K 3.9  CL 103  CO2 22  GLUCOSE 167*  BUN 21  CREATININE 1.22*  CALCIUM 9.6  AST 27  ALT 26  ALKPHOS 56  BILITOT 0.4   ------------------------------------------------------------------------------------------------------------------  Cardiac Enzymes No results for input(s): TROPONINI in the last 168 hours. ------------------------------------------------------------------------------------------------------------------  RADIOLOGY:  CT Head Wo Contrast  Result Date: 05/25/2020 CLINICAL DATA:  Encephalopathy EXAM: CT HEAD WITHOUT CONTRAST TECHNIQUE: Contiguous axial images were obtained from the base of the skull through the vertex without intravenous contrast. COMPARISON:  03/02/2018 FINDINGS: Brain: There is no mass, hemorrhage or extra-axial collection. There is generalized atrophy without lobar predilection. Hypodensity of the white matter is most commonly associated with chronic microvascular disease. Vascular: Atherosclerotic calcification of the vertebral and internal carotid arteries at the skull base. No abnormal hyperdensity of the major intracranial arteries or dural venous sinuses. Skull: The  visualized skull base, calvarium and extracranial soft tissues are normal. Sinuses/Orbits: No fluid levels or advanced mucosal thickening of the visualized paranasal sinuses. No mastoid or middle ear effusion. The orbits are normal. IMPRESSION: Generalized atrophy and chronic microvascular ischemia without acute intracranial abnormality. Electronically Signed   By: Deatra Robinson M.D.   On: 05/25/2020 02:08   CT ABDOMEN PELVIS W CONTRAST  Result Date: 05/25/2020 CLINICAL DATA:  Altered mental status. EXAM: CT ABDOMEN AND PELVIS WITH CONTRAST TECHNIQUE: Multidetector CT imaging of the abdomen and pelvis was performed using the standard protocol following bolus administration of intravenous contrast. CONTRAST:  24mL OMNIPAQUE IOHEXOL 300 MG/ML  SOLN COMPARISON:  May 10, 2019 and July 28, 2016 FINDINGS: Lower chest: A stable, benign 7 mm noncalcified lung nodule is seen within the left lung base. Hepatobiliary: A stable 3.3 cm x 3.3 cm low-attenuation liver lesion is seen within the left lobe. No gallstones, gallbladder wall thickening, or biliary dilatation. Pancreas: Unremarkable. No pancreatic ductal dilatation or surrounding inflammatory changes. Spleen: Normal in size without focal abnormality. Adrenals/Urinary Tract: Adrenal glands are unremarkable. Kidneys are normal, without renal calculi or hydronephrosis. A small, stable renal cysts are seen within the left kidney. Bladder is unremarkable. Stomach/Bowel: There is a large gastric hernia. Appendix appears normal. No evidence of bowel wall thickening, distention, or inflammatory changes. Noninflamed diverticula are seen throughout the sigmoid colon. Additional small bowel diverticula are seen within the mid to upper left abdomen (axial CT images 28 through 35, CT series number 2). These are increased in size when compared to the prior exam. Vascular/Lymphatic: Aortic atherosclerosis. No enlarged abdominal or pelvic lymph nodes. Reproductive: Uterus and  bilateral adnexa are unremarkable. Other: No abdominal wall hernia or abnormality. No abdominopelvic ascites. Musculoskeletal: Multilevel degenerative changes seen throughout the lumbar spine. IMPRESSION: 1. Large, stable gastric hernia. 2. Sigmoid and small bowel diverticulosis. 3. Stable hepatic cyst. 4. Aortic atherosclerosis. Aortic Atherosclerosis (ICD10-I70.0). Electronically Signed   By: Aram Candela M.D.   On: 05/25/2020 02:32   DG Chest Portable 1 View  Result Date: 05/25/2020 CLINICAL DATA:  Weakness EXAM: PORTABLE CHEST 1 VIEW COMPARISON:  None. FINDINGS: Mild cardiomegaly. Bibasilar atelectasis. No pneumothorax or sizable pleural effusion. IMPRESSION: Mild cardiomegaly and bibasilar atelectasis. Electronically Signed   By: Deatra Robinson M.D.   On: 05/25/2020 00:09      IMPRESSION AND PLAN:  Active Problems:   Dehydration  1.  Recurrent nausea and vomiting with subsequent dehydration and acute kidney injury as well as elevated lactic acid. -The patient will be admitted to a medical bed. -We will continue hydration with IV normal saline and follow BMP. -As needed antiemetics will be provided. -Lactic acid level is already coming down with hydration.  2.  Generalized weakness and fatigue with failure to thrive. -Physical therapy consult will be obtained to assess and assist with ambulation. -We will check TSH level.  3.  Recent E. coli UTI. -We will continue antibiotic therapy with  IV Rocephin for now given acute kidney injury after p.o. Bactrim. -We will follow repeat urine culture.  4.  Essential hypertension. -We will continue p.o. Norvasc and hold off Cozaar given mild acute kidney injury.  5.  GERD. -We will continue PPI therapy.  6.  Depression. -We will continue Lexapro and Cymbalta.  DVT prophylaxis: Lovenox.   Code Status: full code.   Family Communication:  The plan of care was discussed in details with the patient (and family). I answered all questions.  The patient agreed to proceed with the above mentioned plan. Further management will depend upon hospital course. Disposition Plan: Back to previous home environment Consults called: none.   All the records are reviewed and case discussed with ED provider.  Status is: Inpatient  Remains inpatient appropriate because:Ongoing diagnostic testing needed not appropriate for outpatient work up, Unsafe d/c plan, IV treatments appropriate due to intensity of illness or inability to take PO and Inpatient level of care appropriate due to severity of illness   Dispo: The patient is from: Home              Anticipated d/c is to: Home              Patient currently is not medically stable to d/c.   Difficult to place patient No   TOTAL TIME TAKING CARE OF THIS PATIENT: 55 minutes.    Daveena Elmore A Raksha Wolfgang MHannah Beat.D on 05/25/2020 at 4:18 AM  [facility]   From 7 PM-7 AM, contact night-coverage [email or web address]  CC: Primary care physician; Lollie MarrowBlomberg, Ben A, MD

## 2020-05-25 NOTE — Progress Notes (Signed)
PHARMACIST - PHYSICIAN ORDER COMMUNICATION  CONCERNING: P&T Medication Policy on Herbal Medications  DESCRIPTION:  This patient's order for:  Cranberry Caps  has been noted.  This product(s) is classified as an "herbal" or natural product. Due to a lack of definitive safety studies or FDA approval, nonstandard manufacturing practices, plus the potential risk of unknown drug-drug interactions while on inpatient medications, the Pharmacy and Therapeutics Committee does not permit the use of "herbal" or natural products of this type within [facility].   ACTION TAKEN: The pharmacy department is unable to verify this order at this time . Please reevaluate patient's clinical condition at discharge and address if the herbal or natural product(s) should be resumed at that time.  Otelia Sergeant, PharmD, [facility] 05/25/2020 4:19 AM

## 2020-05-25 NOTE — ED Notes (Signed)
Daughter requesting that pt is not straight catheterized to obtain urine form traumatic experiences on previous admissions. Pt placed on purewick

## 2020-05-25 NOTE — ED Notes (Signed)
Pt resting in room with daughter at bedside. Awaiting UA results then dispo

## 2020-05-26 LAB — CBC
HCT: 39.6 % (ref 36.0–46.0)
Hemoglobin: 13 g/dL (ref 12.0–15.0)
MCH: 31.2 pg (ref 26.0–34.0)
MCHC: 32.8 g/dL (ref 30.0–36.0)
MCV: 95 fL (ref 80.0–100.0)
Platelets: 301 10*3/uL (ref 150–400)
RBC: 4.17 MIL/uL (ref 3.87–5.11)
RDW: 13.5 % (ref 11.5–15.5)
WBC: 11.8 10*3/uL — ABNORMAL HIGH (ref 4.0–10.5)
nRBC: 0 % (ref 0.0–0.2)

## 2020-05-26 LAB — BASIC METABOLIC PANEL
Anion gap: 9 (ref 5–15)
BUN: 9 mg/dL (ref 8–23)
CO2: 22 mmol/L (ref 22–32)
Calcium: 8.7 mg/dL — ABNORMAL LOW (ref 8.9–10.3)
Chloride: 108 mmol/L (ref 98–111)
Creatinine, Ser: 0.77 mg/dL (ref 0.44–1.00)
GFR, Estimated: >60 mL/min (ref >60)
Glucose, Bld: 118 mg/dL — ABNORMAL HIGH (ref 70–99)
Potassium: 3.6 mmol/L (ref 3.5–5.1)
Sodium: 139 mmol/L (ref 135–145)

## 2020-05-26 LAB — URINE CULTURE: Culture: NO GROWTH

## 2020-05-26 LAB — MAGNESIUM: Magnesium: 2.1 mg/dL (ref 1.7–2.4)

## 2020-05-26 MED ORDER — BISACODYL 5 MG PO TBEC: 5.0000 mg | DELAYED_RELEASE_TABLET | Freq: Every day | ORAL | Status: DC | PRN

## 2020-05-26 MED ORDER — ENOXAPARIN SODIUM 40 MG/0.4ML ~~LOC~~ SOLN
40.0000 mg | SUBCUTANEOUS | Status: DC
  Administered 2020-05-27 – 2020-05-30 (×4): 40 mg via SUBCUTANEOUS
  Filled 2020-05-26 (×4): qty 0.4

## 2020-05-26 MED ORDER — TRAZODONE HCL 50 MG PO TABS
25.0000 mg | ORAL_TABLET | Freq: Two times a day (BID) | ORAL | Status: DC
  Administered 2020-05-27 – 2020-05-29 (×6): 25 mg via ORAL
  Filled 2020-05-26 (×7): qty 1

## 2020-05-26 MED ORDER — SENNOSIDES-DOCUSATE SODIUM 8.6-50 MG PO TABS
1.0000 | ORAL_TABLET | Freq: Two times a day (BID) | ORAL | Status: DC
  Administered 2020-05-26 – 2020-05-29 (×7): 1 via ORAL
  Filled 2020-05-26 (×8): qty 1

## 2020-05-26 MED ORDER — BISACODYL 10 MG RE SUPP: 10.0000 mg | Freq: Every day | RECTAL | Status: DC | PRN

## 2020-05-26 MED ORDER — POLYETHYLENE GLYCOL 3350 17 G PO PACK
17.0000 g | PACK | Freq: Every day | ORAL | Status: DC
  Administered 2020-05-26: 16:00:00 17 g via ORAL
  Filled 2020-05-26 (×4): qty 1

## 2020-05-26 NOTE — Progress Notes (Signed)
PROGRESS NOTE    Dana Curtis   JSE:[id number]  DOB: 1921/04/16  PCP: Lollie Marrow, MD    DOA: 05/24/2020 LOS: 1   Brief Narrative   Dana Curtis is a 87 y.o. female with medical history significant for hypertension, dementia and GERD who lives at home with daughters, who presented to the ED on 05/25/20 around midnight with recurrent nausea, vomiting over the last 5 to 6 days, in addition to recent UTI with progressive generalized weakness to the point pt was unable to even stand up. Treated for UTI as outpatient, initially with Keflex until cultures return showing resistance.  Changed to Bactrim, however continue to have worsening weakness and nausea with abdominal pain.    Evaluation in the ED showed signs of dehydration with AKI, lactic acidosis, UA with pyuria and high specific gravity.  Pt admitted and started on IV hydration in addition to Rocephin for her UTI.    Assessment & Plan   Active Problems:   AKI (acute kidney injury) (HCC)   Nausea & vomiting   Lactic acidosis   Essential hypertension   GERD (gastroesophageal reflux disease)   Depression   Dehydration   Generalized weakness   Dehydation - POA as a result of  Nausea, vomiting, abdominal pain - appears resolved.  Due to UTI most likely. Acute kidney injury - POA, resolved with IV fluids.  Was likely due to both hypovolemia/pre-renal azotemia along with Bactrim.   --monitor BMP --stop IV fluids and monitor for adequate PO intake/hydration --encourage pt to drink fluids  Urinary Tract Infection, recent, due to E. coli - recently treated outpatient but pt presented with worsening weakness, confusion and still with N/V.   --Continue Rocephin and follow urine culture, treat for 3 days   Acute metabolic encephalopathy with underlying dementia.  Acute changes related to urinary tract infection and dehydration. Dementia with behavioral disturbances - pt became comabative and increasingly agitated.  This afternoon  required IV medication for safety of patient and staff as she was kicking and swinging at staff providing care. --Continue home trazodone BID (per daughter, helps agitation at BID dosing) --Sitter if needed for safety --No benefit with Ativan, would avoid benzos --Better result with low dose Haldol --IV Haldol 2mg  q6h PRN agitation  ----Delirium precautions:  -Lights and TV off, minimize interruptions at night -Blinds open and lights on during day -Glasses/hearing aid with patient -Frequent reorientation -PT/OT when able -Avoid sedation medications/Beers list medications  Generalized weakness / ?Failure to thrive - weakness seems related to infection, given its course has been recently progressive in setting of UTI. Patient is from home with daughters. --PT and OT evaluations pending  --Fall precautions  Hypertension - continue amlodipine.  Losartan held due to AKI on admission.  GERD - continue PPI  Depression - continue Cymbalta and Lexapro   Patient BMI: Body mass index is 30.31 kg/m.   DVT prophylaxis: enoxaparin (LOVENOX) injection 30 mg Start: 05/25/20 1000   Diet:  Diet Orders (From admission, onward)    Start     Ordered   05/25/20 0415  Diet Heart Room service appropriate? Yes; Fluid consistency: Thin  Diet effective now       Question Answer Comment  Room service appropriate? Yes   Fluid consistency: Thin      05/25/20 0415            Code Status: Full Code    Subjective 05/26/20    Pt seen with daughter at bedside, sitter present.  She was sleeping comfortably, did awake to voice briefly.  She denied any pain or other issues.  Says "I'm okay".  Reportedly no sleep overnight, was up and agitated.  Given medication and has been sleeping this AM.   Disposition Plan & Communication   Status is: Inpatient  Inpatient status appropriate due severity of illness, monitor renal function off IV fluids, PT and OT evaluations pending given  profound weakness  Dispo: The patient is from: home              Anticipated d/c is to: home              Patient currently not medically stable for d/c.   Difficult to place patient no  Family Communication: daughter at bedside on rounds today, 4/10   Consults, Procedures, Significant Events   Consultants:   none  Procedures:   none  Antimicrobials:  Anti-infectives (From admission, onward)   Start     Dose/Rate Route Frequency Ordered Stop   05/25/20 2200  cefTRIAXone (ROCEPHIN) 1 g in sodium chloride 0.9 % 100 mL IVPB        1 g 200 mL/hr over 30 Minutes Intravenous Every 24 hours 05/25/20 0415 05/27/20 2159   05/25/20 1000  sulfamethoxazole-trimethoprim (BACTRIM DS) 800-160 MG per tablet 1 tablet  Status:  Discontinued        1 tablet Oral 2 times daily 05/25/20 0415 05/25/20 2111   05/24/20 2330  cefTRIAXone (ROCEPHIN) 1 g in sodium chloride 0.9 % 100 mL IVPB        1 g 200 mL/hr over 30 Minutes Intravenous  Once 05/24/20 2323 05/25/20 0021        Micro    Objective   Vitals:   05/25/20 1729 05/25/20 2034 05/26/20 0717 05/26/20 1119  BP: (!) 150/106 (!) 156/66 134/65 (!) 142/40  Pulse: (!) 109 79 98 62  Resp:  18 16 16   Temp:  98.5 F (36.9 C) 99.2 F (37.3 C) (!) 97.5 F (36.4 C)  TempSrc:  Oral    SpO2: 98% 100% 96% 99%  Weight:      Height:        Intake/Output Summary (Last 24 hours) at 05/26/2020 1321 Last data filed at 05/26/2020 0525 Gross per 24 hour  Intake 801.7 ml  Output --  Net 801.7 ml   Filed Weights   05/24/20 2232  Weight: 65.8 kg    Physical Exam:  General exam: sleeping comfortably, no acute distress Respiratory system: CTAB, no wheezes, rales or rhonchi, normal respiratory effort. Cardiovascular system: normal S1/S2, RRR, no pedal edema.   Gastrointestinal system: soft, NT, ND, no HSM felt, +bowel sounds. Central nervous system: no gross focal neurologic deficits, normal speech Skin: dry, intact, normal  temperature    Labs   Data Reviewed: I have personally reviewed following labs and imaging studies  CBC: Recent Labs  Lab 05/24/20 2314 05/25/20 0508 05/26/20 0427  WBC 11.6* 10.7* 11.8*  HGB 13.6 13.0 13.0  HCT 41.7 40.3 39.6  MCV 97.9 97.1 95.0  PLT 310 294 301   Basic Metabolic Panel: Recent Labs  Lab 05/24/20 2314 05/25/20 0508 05/26/20 0427  NA 137 140 139  K 3.9 3.7 3.6  CL 103 108 108  CO2 22 25 22   GLUCOSE 167* 100* 118*  BUN 21 17 9   CREATININE 1.22* 1.03* 0.77  CALCIUM 9.6 9.0 8.7*  MG  --   --  2.1   GFR: Estimated Creatinine Clearance: 33 mL/min (  by C-G formula based on SCr of 0.77 mg/dL). Liver Function Tests: Recent Labs  Lab 05/24/20 2314  AST 27  ALT 26  ALKPHOS 56  BILITOT 0.4  PROT 6.9  ALBUMIN 4.0   Recent Labs  Lab 05/24/20 2314  LIPASE 28   No results for input(s): AMMONIA in the last 168 hours. Coagulation Profile: No results for input(s): INR, PROTIME in the last 168 hours. Cardiac Enzymes: No results for input(s): CKTOTAL, CKMB, CKMBINDEX, TROPONINI in the last 168 hours. BNP (last 3 results) No results for input(s): PROBNP in the last 8760 hours. HbA1C: No results for input(s): HGBA1C in the last 72 hours. CBG: No results for input(s): GLUCAP in the last 168 hours. Lipid Profile: No results for input(s): CHOL, HDL, LDLCALC, TRIG, CHOLHDL, LDLDIRECT in the last 72 hours. Thyroid Function Tests: No results for input(s): TSH, T4TOTAL, FREET4, T3FREE, THYROIDAB in the last 72 hours. Anemia Panel: No results for input(s): VITAMINB12, FOLATE, FERRITIN, TIBC, IRON, RETICCTPCT in the last 72 hours. Sepsis Labs: Recent Labs  Lab 05/24/20 2349 05/25/20 0237  LATICACIDVEN 2.9* 1.7    Recent Results (from the past 240 hour(s))  Blood culture (single)     Status: None (Preliminary result)   Collection Time: 05/24/20 11:49 PM   Specimen: BLOOD  Result Value Ref Range Status   Specimen Description [facility]LOOD RIGHT [facility]  Final    Special Requests   Final    BOTTLES DRAWN AEROBIC AND ANAEROBIC Blood Culture results may not be optimal due to an inadequate volume of blood received in culture bottles   Culture   Final    NO GROWTH 1 DAY Performed at [facility], [address] [place], [place] [address]    Report Status PENDING  Incomplete  SARS CORONAVIRUS 2 (TAT 6-24 HRS) Nasopharyngeal Nasopharyngeal Swab     Status: None   Collection Time: 05/25/20 12:22 AM   Specimen: Nasopharyngeal Swab  Result Value Ref Range Status   SARS Coronavirus 2 NEGATIVE NEGATIVE Final    Comment: (NOTE) SARS-CoV-2 target nucleic acids are NOT DETECTED.  The SARS-CoV-2 RNA is generally detectable in upper and lower respiratory specimens during the acute phase of infection. Negative results do not preclude SARS-CoV-2 infection, do not rule out co-infections with other pathogens, and should not be used as the sole basis for treatment or other patient management decisions. Negative results must be combined with clinical observations, patient history, and epidemiological information. The expected result is Negative.  Fact Sheet for Patients: HairSlick.nohttps://www.fda.gov/media/138098/download  Fact Sheet for Healthcare Providers: quierodirigir.comhttps://www.fda.gov/media/138095/download  This test is not yet approved or cleared by the [place] FDA and  has been authorized for detection and/or diagnosis of SARS-CoV-2 by FDA under an Emergency Use Authorization (EUA). This EUA will remain  in effect (meaning this test can be used) for the duration of the COVID-19 declaration under Se ction 564(b)(1) of the Act, 21 U.S.C. section 360bbb-3(b)(1), unless the authorization is terminated or revoked sooner.  Performed at [facility], [address] [place], [place] [address]   Urine Culture     Status: None   Collection Time: 05/25/20  1:49 AM   Specimen: Urine, Random  Result Value Ref Range Status   Specimen Description   Final     URINE, RANDOM Performed at [facility], [address] [place], [place] [address]    Special Requests   Final    NONE Performed at [facility], [address] [place],  [place] [address]    Culture   Final    NO GROWTH Performed at [facility], [address] [place], [place] [address]    Report Status 05/26/2020 FINAL  Final      Imaging Studies   CT Head Wo Contrast  Result Date: 05/25/2020 CLINICAL DATA:  Encephalopathy EXAM: CT HEAD WITHOUT CONTRAST TECHNIQUE: Contiguous axial images were obtained from the base of the skull through the vertex without intravenous contrast. COMPARISON:  03/02/2018 FINDINGS: Brain: There is no mass, hemorrhage or extra-axial collection. There is generalized atrophy without lobar predilection. Hypodensity of the white matter is most commonly associated with chronic microvascular disease. Vascular: Atherosclerotic calcification of the vertebral and internal carotid arteries at the skull base. No abnormal hyperdensity of the major intracranial arteries or dural venous sinuses. Skull: The visualized skull base, calvarium and extracranial soft tissues are normal. Sinuses/Orbits: No fluid levels or advanced mucosal thickening of the visualized paranasal sinuses. No mastoid or middle ear effusion. The orbits are normal. IMPRESSION: Generalized atrophy and chronic microvascular ischemia without acute intracranial abnormality. Electronically Signed   By: Deatra Robinson M.D.   On: 05/25/2020 02:08   CT ABDOMEN PELVIS W CONTRAST  Result Date: 05/25/2020 CLINICAL DATA:  Altered mental status. EXAM: CT ABDOMEN AND PELVIS WITH CONTRAST TECHNIQUE: Multidetector CT imaging of the abdomen and pelvis was performed using the standard protocol following bolus administration of intravenous contrast. CONTRAST:  75mL OMNIPAQUE IOHEXOL 300 MG/ML  SOLN COMPARISON:  May 10, 2019 and July 28, 2016 FINDINGS: Lower chest: A stable, benign 7 mm  noncalcified lung nodule is seen within the left lung base. Hepatobiliary: A stable 3.3 cm x 3.3 cm low-attenuation liver lesion is seen within the left lobe. No gallstones, gallbladder wall thickening, or biliary dilatation. Pancreas: Unremarkable. No pancreatic ductal dilatation or surrounding inflammatory changes. Spleen: Normal in size without focal abnormality. Adrenals/Urinary Tract: Adrenal glands are unremarkable. Kidneys are normal, without renal calculi or hydronephrosis. A small, stable renal cysts are seen within the left kidney. Bladder is unremarkable. Stomach/Bowel: There is a large gastric hernia. Appendix appears normal. No evidence of bowel wall thickening, distention, or inflammatory changes. Noninflamed diverticula are seen throughout the sigmoid colon. Additional small bowel diverticula are seen within the mid to upper left abdomen (axial CT images 28 through 35, CT series number 2). These are increased in size when compared to the prior exam. Vascular/Lymphatic: Aortic atherosclerosis. No enlarged abdominal or pelvic lymph nodes. Reproductive: Uterus and bilateral adnexa are unremarkable. Other: No abdominal wall hernia or abnormality. No abdominopelvic ascites. Musculoskeletal: Multilevel degenerative changes seen throughout the lumbar spine. IMPRESSION: 1. Large, stable gastric hernia. 2. Sigmoid and small bowel diverticulosis. 3. Stable hepatic cyst. 4. Aortic atherosclerosis. Aortic Atherosclerosis (ICD10-I70.0). Electronically Signed   By: Aram Candela M.D.   On: 05/25/2020 02:32   DG Chest Portable 1 View  Result Date: 05/25/2020 CLINICAL DATA:  Weakness EXAM: PORTABLE CHEST 1 VIEW COMPARISON:  None. FINDINGS: Mild cardiomegaly. Bibasilar atelectasis. No pneumothorax or sizable pleural effusion. IMPRESSION: Mild cardiomegaly and bibasilar atelectasis. Electronically Signed   By: Deatra Robinson M.D.   On: 05/25/2020 00:09     Medications   Scheduled Meds: . amLODipine  5 mg  Oral Daily  . calcium-vitamin D  1 tablet Oral QHS  . diclofenac Sodium  2 g Topical BID  . DULoxetine  30 mg Oral Daily  . enoxaparin (LOVENOX) injection  30 mg Subcutaneous Q24H  . escitalopram  5 mg Oral Daily  .  estradiol  1 Applicatorful Vaginal QHS  . melatonin  2.5 mg Oral QHS  . pantoprazole  40 mg Oral Daily  . polyethylene glycol  17 g Oral Daily  . senna-docusate  1 tablet Oral BID  . traZODone  25 mg Oral BID   Continuous Infusions: . cefTRIAXone (ROCEPHIN)  IV 1 g (05/25/20 2109)       LOS: 1 day    Time spent: 30 minutes with > 50% spent at bedside and in coordination of care.    Pennie Banter, DO [facility]  05/26/2020, 1:21 PM      If 7PM-7AM, please contact night-coverage. How to contact the [facility] Attending or Consulting provider 7A - 7P or covering provider during after hours 7P -7A, for this patient?    1. Check the care team in [facility] and look for a) attending/consulting [facility] provider listed and b) the [facility] team listed 2. Log into [email or web address] and use [facility] universal password to access. If you do not have the password, please contact the hospital operator. 3. Locate the [facility] provider you are looking for under [facility] and page to a number that you can be directly reached. 4. If you still have difficulty reaching the provider, please page the [facility] (Director on Call) for the Hospitalists listed on amion for assistance.

## 2020-05-26 NOTE — Hospital Course (Addendum)
87 y.o. female hypertension, dementia and GERD who lives at home with daughters, who presented to the ED on 05/25/20 around midnight with recurrent nausea, vomiting over the last 5 to 6 days, in addition to recent UTI with progressive generalized weakness to the point pt was unable to even stand up. Treated for UTI as outpatient, initially with Keflex until cultures return showing resistance.  Changed to Bactrim, however continue to have worsening weakness and nausea with abdominal pain.    Evaluation in the ED showed signs of dehydration with AKI, lactic acidosis, UA with pyuria and high specific gravity.  Pt admitted and started on IV hydration in addition to Rocephin for her UTI.    Volume depletion secondary to nausea vomiting from UTI AKI Recent E. coli infection Dementia with behavioral disturbances Adult failure to thrive HTN

## 2020-05-27 LAB — CBC
HCT: 39.8 % (ref 36.0–46.0)
Hemoglobin: 13.1 g/dL (ref 12.0–15.0)
MCH: 31.7 pg (ref 26.0–34.0)
MCHC: 32.9 g/dL (ref 30.0–36.0)
MCV: 96.4 fL (ref 80.0–100.0)
Platelets: 289 10*3/uL (ref 150–400)
RBC: 4.13 MIL/uL (ref 3.87–5.11)
RDW: 13.7 % (ref 11.5–15.5)
WBC: 11.7 10*3/uL — ABNORMAL HIGH (ref 4.0–10.5)
nRBC: 0 % (ref 0.0–0.2)

## 2020-05-27 LAB — BASIC METABOLIC PANEL
Anion gap: 9 (ref 5–15)
BUN: 10 mg/dL (ref 8–23)
CO2: 23 mmol/L (ref 22–32)
Calcium: 8.6 mg/dL — ABNORMAL LOW (ref 8.9–10.3)
Chloride: 108 mmol/L (ref 98–111)
Creatinine, Ser: 0.79 mg/dL (ref 0.44–1.00)
GFR, Estimated: >60 mL/min (ref >60)
Glucose, Bld: 100 mg/dL — ABNORMAL HIGH (ref 70–99)
Potassium: 4.2 mmol/L (ref 3.5–5.1)
Sodium: 140 mmol/L (ref 135–145)

## 2020-05-27 MED ORDER — MELATONIN 3 MG PO TABS
6.0000 mg | ORAL_TABLET | Freq: Every day | ORAL | Status: DC
  Filled 2020-05-27: qty 2

## 2020-05-27 MED ORDER — MELATONIN 5 MG PO TABS
5.0000 mg | ORAL_TABLET | Freq: Every day | ORAL | Status: DC
  Administered 2020-05-27 – 2020-05-29 (×3): 5 mg via ORAL
  Filled 2020-05-27 (×4): qty 1

## 2020-05-27 NOTE — Evaluation (Signed)
Occupational Therapy Evaluation Patient Details Name: Dana Curtis MRN: [id number] DOB: 1921-06-17 Today's Date: 05/27/2020    History of Present Illness Pt is a 87 y/o F with PMH: HTN, dementia and GERD who lives with dtrs and presented to ED d/t nausea, vomiting and progressive weakness on top of recent tx for UTI (outpatient). Pt adm for AKI 2/2 hypovolemia.   Clinical Impression   Pt seen for OT evaluation this date in setting of acute hospitalization d/t AKI and dehydration. Pt presents this date largely fatigued/lethargic from medication received overnight. Pt presents this date with decreased fxl activity tolerance, decreased cognition, and weakness impacting her ability to safely contribute to ADLs/ADL mobility. Pt lives with 2 daughters in a home with a ramped entrance. Pt's daughter reports that at baseline, she is able to perform some seated UB ADLs with cues only for sequencing, states that she is able to perform most toileting tasks for voiding, but requires some assist for peri care with BMs. Pt's daughter reports that pt alternates use of RW versus w/c for fxl mobility and waxes and wanes some with her ability to transfer herself to/from w/c or needing assistance from her daughters. Of late, pt's family reports that she has been requiring 2-3 person assistance for ADL transfers and it has become unmanageable in the past week or so. Pt requires MOD/MAX A with bed mobility this date, MIN/MOD A to perform hand to mouth to take a drink of water, and demos poor sitting balance requiring MOD A to sustain static sitting. Transfers not assessed at this time as pt too weak to reasonably contribute to safe transfers. Will continue to follow acutely and anticipate that pt will require f/u OT services in STR setting.     Follow Up Recommendations  SNF    Equipment Recommendations  Other (comment) (defer to next level of care)    Recommendations for Other Services       Precautions /  Restrictions Precautions Precautions: Fall Restrictions Weight Bearing Restrictions: No      Mobility Bed Mobility Overal bed mobility: Needs Assistance Bed Mobility: Supine to Sit;Sit to Supine     Supine to sit: Mod assist Sit to supine: Max assist   General bed mobility comments: increased time, cues, and increased assist req'ed for back to bed to manage LEs.    Transfers                 General transfer comment: not attempted, unsafe    Balance Overall balance assessment: Needs assistance Sitting-balance support: Feet supported;Bilateral upper extremity supported Sitting balance-Leahy Scale: Poor Sitting balance - Comments: requries MIN/MOD A external support as well as B UE support to sustain static sitting, unable to alterate hands for functional tasks without more extensive sitting support (MOD/MAX)                                   ADL either performed or assessed with clinical judgement   ADL Overall ADL's : Needs assistance/impaired                                       General ADL Comments: MIN/MOD A for UB ADLs including hand to mouth for self-drinking with cup with lid and straw, MAX A for LB ADLs including threading socks     Vision   Additional  Comments: difficult to formally assess d/t pt's cognition this date. Does appear to track appropriately when visually attending, but requires increased time and cues to attend     Perception     Praxis      Pertinent Vitals/Pain Pain Assessment: Faces Faces Pain Scale: Hurts little more Pain Location: LEs with mobilizing to EOB Pain Descriptors / Indicators: Aching;Sore Pain Intervention(s): Limited activity within patient's tolerance;Monitored during session     Hand Dominance     Extremity/Trunk Assessment Upper Extremity Assessment Upper Extremity Assessment: Generalized weakness   Lower Extremity Assessment Lower Extremity Assessment: Generalized weakness        Communication     Cognition Arousal/Alertness: Suspect due to medications Behavior During Therapy: Flat affect Overall Cognitive Status: No family/caregiver present to determine baseline cognitive functioning                                 General Comments: pt follows ~50-60% of simple one step commands wtih increased cues and processing time. RN endorses that pt recieved calming medication overnight for safety and that she has been largely asleep this date. Pt does rouse to stimuli. Pt's daughter reports that she is moderately confused at baseline. Follows simple commands well, but needs cues for basic ADLs such as washing her hands.   General Comments       Exercises     Shoulder Instructions      Home Living Family/patient expects to be discharged to:: Private residence Living Arrangements: Children (3 daughters) Available Help at Discharge: Family;Available 24 hours/day Type of Home: House Home Access: Ramped entrance     Home Layout: Two level;Able to live on main level with bedroom/bathroom               Home Equipment: Dan Humphreys - 2 wheels;Wheelchair - manual          Prior Functioning/Environment Level of Independence: Needs assistance  Gait / Transfers Assistance Needed: Pt was amb short distances with walker and 1p assist. Alternated transferring herself to wheelchair or needing some assist from one daughter. In recent weeks as pt was becoming ill, her dtr reports she was needing 2 and 3 person assistance with transfers and was not longer able to ambulate. ADL's / Homemaking Assistance Needed: Pt has assistance for bathing and dressing, she was mostly able to perform toileting and grooming tasks herself/with setup and cues to sequence 2/2 cognition.            OT Problem List: Decreased strength;Impaired balance (sitting and/or standing);Decreased cognition;Decreased safety awareness;Decreased knowledge of use of DME or AE;Decreased activity  tolerance      OT Treatment/Interventions: Self-care/ADL training;DME and/or AE instruction;Therapeutic activities;Balance training;Therapeutic exercise;Energy conservation;Patient/family education    OT Goals(Current goals can be found in the care plan section) Acute Rehab OT Goals Patient Stated Goal: to get strong enough to at least be able to transfer with one person assisting so that we can fesibly and safely manage at home. OT Goal Formulation: With family Time For Goal Achievement: 06/10/20 Potential to Achieve Goals: Good ADL Goals Pt Will Perform Grooming: with set-up;sitting (to complete 2-3 g/h tasks in unsupported sitting to increase static sitting balance and tolerance.) Pt Will Transfer to Toilet: with min assist;stand pivot transfer;bedside commode Pt Will Perform Toileting - Clothing Manipulation and hygiene: with min assist;sitting/lateral leans  OT Frequency: Min 1X/week   Barriers to D/C:  Co-evaluation              AM-PAC OT "6 Clicks" Daily Activity     Outcome Measure Help from another person eating meals?: A Little Help from another person taking care of personal grooming?: A Little Help from another person toileting, which includes using toliet, bedpan, or urinal?: A Lot Help from another person bathing (including washing, rinsing, drying)?: A Lot Help from another person to put on and taking off regular upper body clothing?: A Lot Help from another person to put on and taking off regular lower body clothing?: A Lot 6 Click Score: 14   End of Session Nurse Communication: Mobility status  Activity Tolerance: Patient limited by fatigue;Patient limited by lethargy Patient left: in bed;with call bell/phone within reach;with bed alarm set  OT Visit Diagnosis: Unsteadiness on feet (R26.81);Muscle weakness (generalized) (M62.81)                Time: 3010-4045 OT Time Calculation (min): 33 min Charges:  OT General Charges $OT Visit: 1  Visit OT Evaluation $OT Eval Moderate Complexity: 1 Mod OT Treatments $Self Care/Home Management : 8-22 mins $Therapeutic Activity: 8-22 mins  Rejeana Brock, MS, OTR/L ascom [phone] 05/27/20, 1:46 PM

## 2020-05-27 NOTE — Progress Notes (Addendum)
PROGRESS NOTE    Dana Curtis   MGQ:[id number]  DOB: April 06, 1921  PCP: Lollie Marrow, MD    DOA: 05/24/2020 LOS: 2   Brief Narrative   Dana Curtis is a 87 y.o. female with medical history significant for hypertension, dementia and GERD who lives at home with daughters, who presented to the ED on 05/25/20 around midnight with recurrent nausea, vomiting over the last 5 to 6 days, in addition to recent UTI with progressive generalized weakness to the point pt was unable to even stand up. Treated for UTI as outpatient, initially with Keflex until cultures return showing resistance.  Changed to Bactrim, however continue to have worsening weakness and nausea with abdominal pain.    Evaluation in the ED showed signs of dehydration with AKI, lactic acidosis, UA with pyuria and high specific gravity.  Pt admitted and started on IV hydration in addition to Rocephin for her UTI.    Assessment & Plan   Active Problems:   AKI (acute kidney injury) (HCC)   Nausea & vomiting   Lactic acidosis   Essential hypertension   GERD (gastroesophageal reflux disease)   Depression   Dehydration   Generalized weakness   Dehydation - POA as a result of  Nausea, vomiting, abdominal pain - appears resolved.  Due to UTI most likely. Acute kidney injury - POA, resolved with IV fluids.  Was likely due to both hypovolemia/pre-renal azotemia along with Bactrim.   --monitor BMP --stop IV fluids and monitor for adequate PO intake/hydration --encourage pt to drink fluids  Urinary Tract Infection, recent, due to E. coli - recently treated outpatient but pt presented with worsening weakness, confusion and still with N/V.   --Continue Rocephin and follow urine culture, treat for 3 days   Acute metabolic encephalopathy with underlying dementia.  Acute changes related to urinary tract infection and dehydration. Dementia with behavioral disturbances - pt became comabative and increasingly agitated.  This afternoon  required IV medication for safety of patient and staff as she was kicking and swinging at staff providing care. --Continue home trazodone BID     (per daughter, helps agitation at BID dosing) --Sitter if needed for safety --No benefit with Ativan, avoid benzos --Better result with Haldol but pt is too sedated  --STOP IV Haldol (had 2mg  q6h PRN agitation) --AVOID SEDATING MEDS OVERNIGHT SO PT CAN BE EVALUATED BY PT AND OT FOR D/C PLANNING ----Delirium precautions:  -Lights and TV off, minimize interruptions at night -Blinds open and lights on during day -Glasses/hearing aid with patient -Frequent reorientation -PT/OT when able -Avoid sedation medications/Beers list medications  Generalized weakness / ?Failure to thrive - weakness seems related to infection, given its course has been recently progressive in setting of UTI. Patient is from home with daughters. --PT and OT evaluations pending  --Fall precautions  Hypertension - continue amlodipine.  Losartan held due to AKI on admission.  Continue hold losartan given low DBP's.  GERD - continue PPI  Depression - continue Cymbalta and Lexapro  Insomnia - on trazodone BID as above.  Increased her home  melatonin for now.   Patient BMI: Body mass index is 30.31 kg/m.   DVT prophylaxis: enoxaparin (LOVENOX) injection 40 mg Start: 05/27/20 1000   Diet:  Diet Orders (From admission, onward)    Start     Ordered   05/25/20 0415  Diet Heart Room service appropriate? Yes; Fluid consistency: Thin  Diet effective now       Question Answer Comment  Room  service appropriate? Yes   Fluid consistency: Thin      05/25/20 0415            Code Status: Full Code    Subjective 05/27/20    Pt seen while sitter feeding her lunch today.  Pt was asleep all morning after getting Haldol for agitation overnight.  Was not able to work with therapy.  Patient reports she feels fine. Denies having pain, trouble breathing or  nausea.     Disposition Plan & Communication   Status is: Inpatient  Inpatient status appropriate due severity of illness, monitor renal function off IV fluids, PT and OT evaluations pending given profound weakness  Dispo: The patient is from: home              Anticipated d/c is to: home              Patient currently not medically stable for d/c.   Difficult to place patient no  Family Communication: daughter at bedside on rounds today, 4/10   Consults, Procedures, Significant Events   Consultants:   none  Procedures:   none  Antimicrobials:  Anti-infectives (From admission, onward)   Start     Dose/Rate Route Frequency Ordered Stop   05/25/20 2200  cefTRIAXone (ROCEPHIN) 1 g in sodium chloride 0.9 % 100 mL IVPB        1 g 200 mL/hr over 30 Minutes Intravenous Every 24 hours 05/25/20 0415 05/27/20 0000   05/25/20 1000  sulfamethoxazole-trimethoprim (BACTRIM DS) 800-160 MG per tablet 1 tablet  Status:  Discontinued        1 tablet Oral 2 times daily 05/25/20 0415 05/25/20 2111   05/24/20 2330  cefTRIAXone (ROCEPHIN) 1 g in sodium chloride 0.9 % 100 mL IVPB        1 g 200 mL/hr over 30 Minutes Intravenous  Once 05/24/20 2323 05/25/20 0021        Micro    Objective   Vitals:   05/27/20 0743 05/27/20 1222 05/27/20 1225 05/27/20 1625  BP: (!) 137/46 (!) 131/49  (!) 134/55  Pulse: 85 93  93  Resp: 17 16  18   Temp: 97.6 F (36.4 C) 97.7 F (36.5 C)  98 F (36.7 C)  TempSrc: Axillary Axillary  Oral  SpO2: 97% 95% 95% 99%  Weight:      Height:        Intake/Output Summary (Last 24 hours) at 05/27/2020 1627 Last data filed at 05/27/2020 0557 Gross per 24 hour  Intake --  Output 300 ml  Net -300 ml   Filed Weights   05/24/20 2232  Weight: 65.8 kg    Physical Exam:  General exam: awake in bed, sitter feeding lunch, drowsy, no acute distress Respiratory system: CTAB, no wheezes, rales or rhonchi, normal respiratory effort. Cardiovascular system: normal  S1/S2, RRR, no pedal edema.   Gastrointestinal system: soft and non-tender, hypoactive bowel sounds Central nervous system: no gross focal neurologic deficits, normal speech Extremities: moves all, no edema, normal tone    Labs   Data Reviewed: I have personally reviewed following labs and imaging studies  CBC: Recent Labs  Lab 05/24/20 2314 05/25/20 0508 05/26/20 0427 05/27/20 0625  WBC 11.6* 10.7* 11.8* 11.7*  HGB 13.6 13.0 13.0 13.1  HCT 41.7 40.3 39.6 39.8  MCV 97.9 97.1 95.0 96.4  PLT 310 294 301 289   Basic Metabolic Panel: Recent Labs  Lab 05/24/20 2314 05/25/20 0508 05/26/20 0427 05/27/20 0625  NA 137  140 139 140  K 3.9 3.7 3.6 4.2  CL 103 108 108 108  CO2 22 25 22 23   GLUCOSE 167* 100* 118* 100*  BUN 21 17 9 10   CREATININE 1.22* 1.03* 0.77 0.79  CALCIUM 9.6 9.0 8.7* 8.6*  MG  --   --  2.1  --    GFR: Estimated Creatinine Clearance: 33 mL/min (by C-G formula based on SCr of 0.79 mg/dL). Liver Function Tests: Recent Labs  Lab 05/24/20 2314  AST 27  ALT 26  ALKPHOS 56  BILITOT 0.4  PROT 6.9  ALBUMIN 4.0   Recent Labs  Lab 05/24/20 2314  LIPASE 28   No results for input(s): AMMONIA in the last 168 hours. Coagulation Profile: No results for input(s): INR, PROTIME in the last 168 hours. Cardiac Enzymes: No results for input(s): CKTOTAL, CKMB, CKMBINDEX, TROPONINI in the last 168 hours. BNP (last 3 results) No results for input(s): PROBNP in the last 8760 hours. HbA1C: No results for input(s): HGBA1C in the last 72 hours. CBG: No results for input(s): GLUCAP in the last 168 hours. Lipid Profile: No results for input(s): CHOL, HDL, LDLCALC, TRIG, CHOLHDL, LDLDIRECT in the last 72 hours. Thyroid Function Tests: No results for input(s): TSH, T4TOTAL, FREET4, T3FREE, THYROIDAB in the last 72 hours. Anemia Panel: No results for input(s): VITAMINB12, FOLATE, FERRITIN, TIBC, IRON, RETICCTPCT in the last 72 hours. Sepsis Labs: Recent Labs  Lab  05/24/20 2349 05/25/20 0237  LATICACIDVEN 2.9* 1.7    Recent Results (from the past 240 hour(s))  Blood culture (single)     Status: None (Preliminary result)   Collection Time: 05/24/20 11:49 PM   Specimen: BLOOD  Result Value Ref Range Status   Specimen Description BLOOD RIGHT [facility]  Final   Special Requests   Final    BOTTLES DRAWN AEROBIC AND ANAEROBIC Blood Culture results may not be optimal due to an inadequate volume of blood received in culture bottles   Culture   Final    NO GROWTH 2 DAYS Performed at [facility], [address], [place], [address] [place]    Report Status PENDING  Incomplete  SARS CORONAVIRUS 2 (TAT 6-24 HRS) Nasopharyngeal Nasopharyngeal Swab     Status: None   Collection Time: 05/25/20 12:22 AM   Specimen: Nasopharyngeal Swab  Result Value Ref Range Status   SARS Coronavirus 2 NEGATIVE NEGATIVE Final    Comment: (NOTE) SARS-CoV-2 target nucleic acids are NOT DETECTED.  The SARS-CoV-2 RNA is generally detectable in upper and lower respiratory specimens during the acute phase of infection. Negative results do not preclude SARS-CoV-2 infection, do not rule out co-infections with other pathogens, and should not be used as the sole basis for treatment or other patient management decisions. Negative results must be combined with clinical observations, patient history, and epidemiological information. The expected result is Negative.  Fact Sheet for Patients: 07371  Fact Sheet for Healthcare Providers: 07/25/20  This test is not yet approved or cleared by the [place] FDA and  has been authorized for detection and/or diagnosis of SARS-CoV-2 by FDA under an Emergency Use Authorization (EUA). This EUA will remain  in effect (meaning this test can be used) for the duration of the COVID-19 declaration under Se ction 564(b)(1) of the Act, 21 U.S.C. section 360bbb-3(b)(1),  unless the authorization is terminated or revoked sooner.  Performed at [facility], [address] [place], [address] [place]   Urine Culture     Status: None  Collection Time: 05/25/20  1:49 AM   Specimen: Urine, Random  Result Value Ref Range Status   Specimen Description   Final    URINE, RANDOM Performed at [facility], [address], [place], [place] [address]    Special Requests   Final    NONE Performed at [facility], [address], [place], [place] [address]    Culture   Final    NO GROWTH Performed at [facility], [address] [place], [place] [address]    Report Status 05/26/2020 FINAL  Final      Imaging Studies   No results found.   Medications   Scheduled Meds: . amLODipine  5 mg Oral Daily  . calcium-vitamin D  1 tablet Oral QHS  . diclofenac Sodium  2 g Topical BID  . DULoxetine  30 mg Oral Daily  . enoxaparin (LOVENOX) injection  40 mg Subcutaneous Q24H  . escitalopram  5 mg Oral Daily  . estradiol  1 Applicatorful Vaginal QHS  . melatonin  6 mg Oral QHS  . pantoprazole  40 mg Oral Daily  . polyethylene glycol  17 g Oral Daily  . senna-docusate  1 tablet Oral BID  . traZODone  25 mg Oral BID   Continuous Infusions:      LOS: 2 days    Time spent: 25 minutes with > 50% spent at bedside and in coordination of care.    Pennie Banter, DO [facility]  05/27/2020, 4:27 PM      If 7PM-7AM, please contact night-coverage. How to contact the [facility] Attending or Consulting provider 7A - 7P or covering provider during after hours 7P -7A, for this patient?    1. Check the care team in [facility] and look for a) attending/consulting TR[facility] provider listed and b) the [facility] team listed 2. Log into [email or web address] and use 's universal password to access. If you do not have the password, please contact the hospital operator. 3. Locate the [facility] provider you are looking for under [facility] and  page to a number that you can be directly reached. 4. If you still have difficulty reaching the provider, please page the [facility] (Director on Call) for the Hospitalists listed on amion for assistance.

## 2020-05-27 NOTE — Progress Notes (Signed)
PT Cancellation Note  Patient Details Name: Dana Curtis MRN: [id number] DOB: 02/03/1922   Cancelled Treatment:    Reason Eval/Treat Not Completed: Fatigue/lethargy limiting ability to participate. Patient sleeping upon arrival, able to wake to my touch and voice, reports she is sick and can't sit up. Will re-attempt PT eval at later date/time.    Lorry Furber 05/27/2020, 1:17 PM

## 2020-05-28 LAB — CBC
HCT: 41.3 % (ref 36.0–46.0)
Hemoglobin: 13.5 g/dL (ref 12.0–15.0)
MCH: 31.4 pg (ref 26.0–34.0)
MCHC: 32.7 g/dL (ref 30.0–36.0)
MCV: 96 fL (ref 80.0–100.0)
Platelets: 280 10*3/uL (ref 150–400)
RBC: 4.3 MIL/uL (ref 3.87–5.11)
RDW: 13.4 % (ref 11.5–15.5)
WBC: 10.8 10*3/uL — ABNORMAL HIGH (ref 4.0–10.5)
nRBC: 0 % (ref 0.0–0.2)

## 2020-05-28 MED ORDER — ATROPINE SULFATE 1 MG/10ML IJ SOSY
PREFILLED_SYRINGE | INTRAMUSCULAR | Status: AC
  Filled 2020-05-28: qty 10

## 2020-05-28 NOTE — Progress Notes (Addendum)
PROGRESS NOTE   Dana Curtis  JYN:[id number] DOB: 03-26-1921 DOA: 05/24/2020 PCP: Lollie Marrow, MD  Brief Narrative:  87 y.o. female hypertension, dementia and GERD who lives at home with daughters, who presented to the ED on 05/25/20 around midnight with recurrent nausea, vomiting over the last 5 to 6 days, in addition to recent UTI with progressive generalized weakness to the point pt was unable to even stand up. Treated for UTI as outpatient, initially with Keflex until cultures return showing resistance.  Changed to Bactrim, however continue to have worsening weakness and nausea with abdominal pain.    Evaluation in the ED showed signs of dehydration with AKI, lactic acidosis, UA with pyuria and high specific gravity.  Pt admitted and started on IV hydration in addition to Rocephin for her UTI.  Hospital-Problem based course  Volume depletion secondary to nausea vomiting from resistant E. coli UTI AKI is resolved-patient is currently saline lock Not really eating or drinking much Resistant E. coli UTI Blood culture negative X 3 days, urine culture no growth from 4/9 Received IV Rocephin until 4/11 Metabolic encephalopathy superimposed on underlying dementia Trazodone twice daily, delirium precautions Frequent reorientation Therapy recommending skilled on discharge Continue melatonin that was ordered Hypertension Continue amlodipine alone-discontinue losartan given age beers criteria medication and risk for AKI Adult failure to thrive  Will discuss with family implications    DVT prophylaxis: Lovenox Code Status: DNR Family Communication: Called and updated daughter Delaney Meigs [phone]  Disposition:  Status is: Inpatient  Remains inpatient appropriate because:Hemodynamically unstable   Dispo: The patient is from: Home              Anticipated d/c is to: SNF              Patient currently is not medically stable to d/c.   Difficult to place patient No   Consultants:    None at this time  Procedures: No  Antimicrobials: Rocephin ending 4/11   Subjective: Confused-is unable to give ROS Food remains untouched at the bedside  Objective: Vitals:   05/28/20 0151 05/28/20 0414 05/28/20 0812 05/28/20 1114  BP: (!) 118/54 126/90 (!) 132/58 (!) 135/56  Pulse: 75 88 72 90  Resp: 18 20 16 16   Temp: 98.8 F (37.1 C) 98.3 F (36.8 C) 97.9 F (36.6 C) (!) 97 F (36.1 C)  TempSrc:   Oral Axillary  SpO2: 96% 94% 97% 95%  Weight:      Height:        Intake/Output Summary (Last 24 hours) at 05/28/2020 1352 Last data filed at 05/27/2020 1854 Gross per 24 hour  Intake 0 ml  Output --  Net 0 ml   Filed Weights   05/24/20 2232  Weight: 65.8 kg    Examination:  Frail white female no distress no icterus no pallor smile symmetric She is quite listless at times She arouses however S1-S2 no murmur-not on telemetry Abdomen soft no rebound no guarding No lower extremity edema Sacrum not examined Neurological exam deferred   Data Reviewed: personally reviewed   CBC    Component Value Date/Time   WBC 10.8 (H) 05/28/2020 0518   RBC 4.30 05/28/2020 0518   HGB 13.5 05/28/2020 0518   HCT 41.3 05/28/2020 0518   PLT 280 05/28/2020 0518   MCV 96.0 05/28/2020 0518   MCH 31.4 05/28/2020 0518   MCHC 32.7 05/28/2020 0518   RDW 13.4 05/28/2020 0518   LYMPHSABS 2.1 11/03/2019 1809   MONOABS 0.9 11/03/2019 1809  EOSABS 0.3 11/03/2019 1809   BASOSABS 0.1 11/03/2019 1809   CMP Latest Ref Rng & Units 05/27/2020 05/26/2020 05/25/2020  Glucose 70 - 99 mg/dL 893(T) 342(A) 768(T)  BUN 8 - 23 mg/dL 10 9 17   Creatinine 0.44 - 1.00 mg/dL 1.57 2.62)  Sodium 135 - 145 mmol/L 140 139 140  Potassium 3.5 - 5.1 mmol/L 4.2 3.6 3.7  Chloride 98 - 111 mmol/L 108 108 108  CO2 22 - 32 mmol/L 23 22 25   Calcium 8.9 - 10.3 mg/dL 0.35(D) ) 9.0  Total Protein 6.5 - 8.1 g/dL - - -  Total Bilirubin 0.3 - 1.2 mg/dL - - -  Alkaline Phos 38 - 126 U/L - - -  AST 15  - 41 U/L - - -  ALT 0 - 44 U/L - - -     Radiology Studies: No results found.   Scheduled Meds: . amLODipine  5 mg Oral Daily  . calcium-vitamin D  1 tablet Oral QHS  . diclofenac Sodium  2 g Topical BID  . DULoxetine  30 mg Oral Daily  . enoxaparin (LOVENOX) injection  40 mg Subcutaneous Q24H  . escitalopram  5 mg Oral Daily  . estradiol  1 Applicatorful Vaginal QHS  . melatonin  5 mg Oral QHS  . pantoprazole  40 mg Oral Daily  . polyethylene glycol  17 g Oral Daily  . senna-docusate  1 tablet Oral BID  . traZODone  25 mg Oral BID   Continuous Infusions:   LOS: 3 days   Time spent: 27 minutes  9.7(C, MD [facility] To contact the attending provider between 7A-7P or the covering provider during after hours 7P-7A, please log into the web site [email or web address] and access using universal [place] password for that web site. If you do not have the password, please call the hospital operator.  05/28/2020, 1:52 PM

## 2020-05-28 NOTE — Evaluation (Signed)
Physical Therapy Evaluation Patient Details Name: Dana Curtis MRN: [id number] DOB: 11-Dec-1921 Today's Date: 05/28/2020   History of Present Illness  Pt is a 87 y/o F with PMH: HTN, dementia and GERD who lives with dtrs and presented to ED d/t nausea, vomiting and progressive weakness on top of recent tx for UTI (outpatient). Pt adm for AKI 2/2 hypovolemia.  Clinical Impression  Patient received in bed, alert. Continues to have safety sitter in room. Patient is HOH. She states" I just died." when I asked how she is today. Patient is lethargic and slow to respond to direction. She required max assist for supine to sit, requires min assist to maintain sitting balance. Performed sit to stand with max +2 hand held assist. She is able to stand with support for a couple of minutes to get cleaned up and bed pad changed. She required min assist to return to supine from sitting position. Patient will continue to benefit from skilled PT while here to improve strength, functional independence, safety.          Follow Up Recommendations SNF;Supervision/Assistance - 24 hour    Equipment Recommendations  Other (comment) (TBD)    Recommendations for Other Services       Precautions / Restrictions Precautions Precautions: Fall Restrictions Weight Bearing Restrictions: No      Mobility  Bed Mobility Overal bed mobility: Needs Assistance Bed Mobility: Supine to Sit;Sit to Supine     Supine to sit: Max assist Sit to supine: Min assist   General bed mobility comments: Increased time, decreased initiation. Required max for sitting up on side of bed.    Transfers Overall transfer level: Needs assistance Equipment used: 2 person hand held assist Transfers: Sit to/from Stand Sit to Stand: Max assist;+2 physical assistance            Ambulation/Gait             General Gait Details: patient unable to ambulate safely at this time  Stairs            Wheelchair Mobility     Modified Rankin (Stroke Patients Only)       Balance Overall balance assessment: Needs assistance Sitting-balance support: Feet supported Sitting balance-Leahy Scale: Fair Sitting balance - Comments: Requires min assist to maintain sitting balance at edge of bed. Posterior leaning   Standing balance support: Bilateral upper extremity supported;During functional activity Standing balance-Leahy Scale: Poor Standing balance comment: Requires B UE support for standing                             Pertinent Vitals/Pain Pain Assessment: No/denies pain    Home Living Family/patient expects to be discharged to:: Skilled nursing facility Living Arrangements: Children Available Help at Discharge: Family;Available 24 hours/day Type of Home: House Home Access: Ramped entrance     Home Layout: Two level;Able to live on main level with bedroom/bathroom Home Equipment: Dan Humphreys - 2 wheels;Wheelchair - manual      Prior Function Level of Independence: Needs assistance   Gait / Transfers Assistance Needed: Pt was amb short distances with walker and 1p assist. Alternated transferring herself to wheelchair or needing some assist from one daughter. In recent weeks as pt was becoming ill, her dtr reports she was needing 2 and 3 person assistance with transfers and was not longer able to ambulate.  ADL's / Homemaking Assistance Needed: Pt has assistance for bathing and dressing, she was mostly able to  perform toileting and grooming tasks herself/with setup and cues to sequence 2/2 cognition.        Hand Dominance        Extremity/Trunk Assessment   Upper Extremity Assessment Upper Extremity Assessment: Generalized weakness    Lower Extremity Assessment Lower Extremity Assessment: Generalized weakness       Communication   Communication: No difficulties  Cognition Arousal/Alertness: Suspect due to medications;Lethargic Behavior During Therapy: Flat affect Overall  Cognitive Status: No family/caregiver present to determine baseline cognitive functioning                                 General Comments: Patient slow to respond to direction/questions. Requires increased time, multimodal cues to participate.      General Comments      Exercises     Assessment/Plan    PT Assessment Patient needs continued PT services  PT Problem List Decreased strength;Decreased mobility;Decreased activity tolerance;Decreased balance;Decreased cognition;Decreased safety awareness;Decreased knowledge of precautions       PT Treatment Interventions Gait training;Functional mobility training;Therapeutic activities;Patient/family education;Balance training;Therapeutic exercise;DME instruction    PT Goals (Current goals can be found in the Care Plan section)  Acute Rehab PT Goals Patient Stated Goal: to get strong enough to at least be able to transfer with one person assisting so that we can fesibly and safely manage at home. PT Goal Formulation: With family Time For Goal Achievement: 06/11/20 Potential to Achieve Goals: Fair    Frequency Min 2X/week   Barriers to discharge        Co-evaluation               AM-PAC PT "6 Clicks" Mobility  Outcome Measure Help needed turning from your back to your side while in a flat bed without using bedrails?: A Lot Help needed moving from lying on your back to sitting on the side of a flat bed without using bedrails?: A Lot Help needed moving to and from a bed to a chair (including a wheelchair)?: A Lot Help needed standing up from a chair using your arms (e.g., wheelchair or bedside chair)?: A Lot Help needed to walk in hospital room?: Total Help needed climbing 3-5 steps with a railing? : Total 6 Click Score: 10    End of Session   Activity Tolerance: Patient limited by fatigue;Patient limited by lethargy Patient left: in bed;with call bell/phone within reach;with nursing/sitter in room Nurse  Communication: Mobility status PT Visit Diagnosis: Unsteadiness on feet (R26.81);Other abnormalities of gait and mobility (R26.89);Muscle weakness (generalized) (M62.81);Difficulty in walking, not elsewhere classified (R26.2)    Time: 9833-8250 PT Time Calculation (min) (ACUTE ONLY): 12 min   Charges:   PT Evaluation $PT Eval Moderate Complexity: 1 Mod          Shermaine Brigham, PT, GCS 05/28/20,12:07 PM

## 2020-05-28 NOTE — Evaluation (Addendum)
Clinical/Bedside Swallow Evaluation Patient Details  Name: Dana Curtis MRN: [id number] Date of Birth: 01-May-1921  Today's Date: 05/28/2020 Time: SLP Start Time (ACUTE ONLY): 0840 SLP Stop Time (ACUTE ONLY): 0940 SLP Time Calculation (min) (ACUTE ONLY): 60 min  Past Medical History:  Past Medical History:  Diagnosis Date  . Chronic GERD   . Dementia (HCC)   . Hypertension   . Renal disorder    Past Surgical History:  Past Surgical History:  Procedure Laterality Date  . TONSILLECTOMY     HPI:  Pt is a 87 y.o. female w/ hypertension, Baseline Dementia and GERD who lives at home with daughters, who presented to the ED on 05/25/20 around midnight with recurrent nausea, vomiting over the last 5 to 6 days, in addition to recent UTI with progressive generalized weakness to the point pt was unable to even stand up. Pt arrived via [facility] with c/o AMS. Per EMS, pt started antibiotic 2 days ago for a UTI, and per family has been increasingly altered since starting it. Pt was admitted w/ recurrent nausea and vomiting with subsequent dehydration and acute kidney injury as well as elevated lactic acid; concern for FTT.  CXR: Mild cardiomegaly and bibasilar atelectasis. CT of Abd.: Large, stable gastric hernia.   Assessment / Plan / Recommendation Clinical Impression  Pt appears to present w/ oropharyngeal phase dysphagia w/ oropharyngeal phase deficits and suspected Neuromuscular impact; also noted declined Cognitive status(baseline Dementia) impacting her overall awareness/engagement during po tasks -- which increases risk for aspiration. Pt required min-mod tactile/verbal/visual cues for orientation to bolus presentation, follow through w/ tasks, and self-feeding support. She often had eyes closed b/t trials during the oral phase, but then fed self w/ setup support when she wanted bites of ice cream. Pt consumed trials of singe ice chips, thin and Nectar liquids via Cup, purees, and minced solids mostly  fed to her w/ hand over hand support. Immediate, overt clinical s/s of aspiration noted w/ trials of thin liquids(coughing) as was noted by NSG yesterday and this morning. W/ trials of Nectar liquids and foods, no cough and no decline in respiratory presentation noted during/post trials. Suspect decreased attention to task as well as potential delayed pharyngeal swallowin initiation(d/t decreased awareness) could have impacted safety and success of pharyngeal swallow w/ thin liquids. Oral phase was c/b slow, deliberate bolus management and oral clearing of all boluses given. She required increased Time for mastication and A-P transfer w/ increased textured, solid foods -- unsure if related to overall weakness, and decreased attention/awareness. Time b/t trials required for pt to fully clear solid foods -- she appeared to orally manage Puree consistency foods more timely. OM exam was cursory d/t pt's reduced follow through, but no unilateral weakness was noted.    Recommend dysphagia level 3 diet(minced meats) w/ Nectar consistency liquids; aspiration precautions; Pills Crushed in puree for safety; feeding support and supervision at meals, reduce Distractions during meals and check for oral clearing during/post intake. REFLUX PRECAUTIONS d/t Large stable Gastric Hernia per chart. NSG/MD updated. Recommend a Palliative Care consult for GOC in light of dysphagia diet, advanced age.  SLP Visit Diagnosis: Dysphagia, oropharyngeal phase (R13.12) (baseline Dementia)    Aspiration Risk  Mild aspiration risk;Moderate aspiration risk;Risk for inadequate nutrition/hydration    Diet Recommendation  Dysphagia level 3 w/ MEATS MINCED w/ gravies added to moisten; NECTAR consistency liquids Via Cup. Aspiration precautions; REFLUX precautions. Supervision and support at all meals w/ eating/drinking. Reduce distractions.  Medication Administration: Crushed with puree (  for safer swallowing)    Other  Recommendations  Recommended Consults: Consider GI evaluation;Consider esophageal assessment (large gastric hernia; Pallative Care for GOC; dietician) Oral Care Recommendations: Oral care BID;Oral care before and after PO;Staff/trained caregiver to provide oral care Other Recommendations: Order thickener from pharmacy;Prohibited food (jello, ice cream, thin soups);Remove water pitcher;Have oral suction available   Follow up Recommendations Skilled Nursing facility being considered per NSG report; if not, then 100% Supervision in the home w/ modified diet and precautions.     Frequency and Duration  TBD at SNF vs HH         Prognosis Prognosis for Safe Diet Advancement: Fair Barriers to Reach Goals: Cognitive deficits;Severity of deficits;Behavior;Time post onset (gastric hernia; advanced age; meds)      Swallow Study   General Date of Onset: 05/24/20 HPI: Pt is a 87 y.o. female w/ hypertension, Baseline Dementia and GERD who lives at home with daughters, who presented to the ED on 05/25/20 around midnight with recurrent nausea, vomiting over the last 5 to 6 days, in addition to recent UTI with progressive generalized weakness to the point pt was unable to even stand up. Pt arrived via [facility] with c/o AMS. Per EMS, pt started antibiotic 2 days ago for a UTI, and per family has been increasingly altered since starting it. Pt was admitted w/ recurrent nausea and vomiting with subsequent dehydration and acute kidney injury as well as elevated lactic acid; concern for FTT.  CXR: Mild cardiomegaly and bibasilar atelectasis. CT of Abd.: Large, stable gastric hernia. Type of Study: Bedside Swallow Evaluation Previous Swallow Assessment: n/a Diet Prior to this Study: Regular;Thin liquids Temperature Spikes Noted: No (wbc 10.8 declining) Respiratory Status: Room air History of Recent Intubation: No Behavior/Cognition: Cooperative;Pleasant mood;Confused;Distractible;Requires cueing (mumbled/muttered speech; eyes closed  at times) Oral Cavity Assessment: Dry Oral Care Completed by SLP: Yes Oral Cavity - Dentition: Missing dentition Vision: Functional for self-feeding Self-Feeding Abilities: Able to feed self;Needs assist;Needs set up Patient Positioning: Upright in bed (needed positioning upright) Baseline Vocal Quality: Low vocal intensity Volitional Cough: Cognitively unable to elicit Volitional Swallow: Unable to elicit    Oral/Motor/Sensory Function Overall Oral Motor/Sensory Function: Within functional limits   Ice Chips Ice chips: Within functional limits Presentation: Spoon (fed; 3 trials)   Thin Liquid Thin Liquid: Impaired Presentation: Cup;Self Fed (supported; 3 trials) Oral Phase Impairments: Poor awareness of bolus (suspected) Pharyngeal  Phase Impairments: Cough - Immediate (x2/3 trials) Other Comments: impact of meds; age    Nectar Thick Nectar Thick Liquid: Within functional limits Presentation: Cup;Self Fed (supported; 6 trials)   Honey Thick Honey Thick Liquid: Not tested   Puree Puree: Within functional limits (adequate) Presentation: Spoon (fed; 6 trials)   Solid     Solid: Impaired (w/ soft foods) Presentation: Spoon (fed; 5 trials) Oral Phase Impairments: Poor awareness of bolus;Impaired mastication (lengthy) Oral Phase Functional Implications: Impaired mastication;Prolonged oral transit Pharyngeal Phase Impairments:  (none)        Jerilynn Som, MS, [employer] Speech Language Pathologist Rehab Services [phone] [facility] 05/28/2020,4:32 PM

## 2020-05-29 NOTE — NC FL2 (Signed)
[employer] MEDICAID FL2 LEVEL OF CARE SCREENING TOOL     IDENTIFICATION  Patient Name: Dana Curtis Birthdate: 12-Dec-1921 Sex: female Admission Date (Current Location): 05/24/2020  [place] and [place] Number:  Chiropodist and Address:  [facility], [address], [place], [place] [address]      Provider Number: [id number]  Attending Physician Name and Address:  Rhetta Mura, MD  Relative Name and Phone Number:  Chrys Racer (daughter) [phone]    Current Level of Care: Hospital Recommended Level of Care: Skilled Nursing Facility Prior Approval Number:    Date Approved/Denied:   PASRR Number: pending  Discharge Plan: SNF    Current Diagnoses: Patient Active Problem List   Diagnosis Date Noted  . Dehydration 05/25/2020  . AKI (acute kidney injury) (HCC) 05/25/2020  . Nausea & vomiting 05/25/2020  . Lactic acidosis 05/25/2020  . Generalized weakness 05/25/2020  . Essential hypertension 05/25/2020  . GERD (gastroesophageal reflux disease) 05/25/2020  . Depression 05/25/2020    Orientation RESPIRATION BLADDER Height & Weight     Self  Normal Incontinent Weight: 65.8 kg Height:  4\' 10"  (147.3 cm)  BEHAVIORAL SYMPTOMS/MOOD NEUROLOGICAL BOWEL NUTRITION STATUS      Incontinent Diet (Dysphagia diet 3- nectar thick liquids)  AMBULATORY STATUS COMMUNICATION OF NEEDS Skin   Extensive Assist Verbally Normal                       Personal Care Assistance Level of Assistance  Bathing,Feeding,Dressing Bathing Assistance: Maximum assistance Feeding assistance: Limited assistance Dressing Assistance: Maximum assistance     Functional Limitations Info             SPECIAL CARE FACTORS FREQUENCY  PT (By licensed PT),OT (By licensed OT),Speech therapy     PT Frequency: 5-7 times per week OT Frequency: 5-7 times per week     Speech Therapy Frequency: once per week      Contractures Contractures Info: Not present     Additional Factors Info  Code Status,Allergies Code Status Info: CNR Allergies Info: Lisinopril, macrobid           Current Medications (05/29/2020):  This is the current hospital active medication list Current Facility-Administered Medications  Medication Dose Route Frequency Provider Last Rate Last Admin  . acetaminophen (TYLENOL) tablet 650 mg  650 mg Oral Q6H PRN Mansy, Jan A, MD       Or  . acetaminophen (TYLENOL) suppository 650 mg  650 mg Rectal Q6H PRN Mansy, Jan A, MD      . amLODipine (NORVASC) tablet 5 mg  5 mg Oral Daily Feb A, DO   5 mg at 05/29/20 0856  . bisacodyl (DULCOLAX) EC tablet 5 mg  5 mg Oral Daily PRN 05/31/20 A, DO      . bisacodyl (DULCOLAX) suppository 10 mg  10 mg Rectal Daily PRN Esaw Grandchild A, DO      . calcium-vitamin D (OSCAL WITH D) 500-200 MG-UNIT per tablet 1 tablet  1 tablet Oral QHS Mansy, Jan A, MD   1 tablet at 05/28/20 2200  . diclofenac Sodium (VOLTAREN) 1 % topical gel 2 g  2 g Topical BID Mansy, Jan A, MD   2 g at 05/28/20 2207  . DULoxetine (CYMBALTA) DR capsule 30 mg  30 mg Oral Daily Mansy, Jan A, MD   30 mg at 05/29/20 0856  . enoxaparin (LOVENOX) injection 40 mg  40 mg Subcutaneous Q24H 05/31/20 A, DO  40 mg at 05/29/20 0856  . escitalopram (LEXAPRO) tablet 5 mg  5 mg Oral Daily Mansy, Jan A, MD   5 mg at 05/29/20 0903  . estradiol (ESTRACE) vaginal cream 1 Applicatorful  1 Applicatorful Vaginal QHS Mansy, Vernetta Honey, MD   1 Applicatorful at 05/28/20 2207  . magnesium hydroxide (MILK OF MAGNESIA) suspension 30 mL  30 mL Oral Daily PRN Mansy, Jan A, MD      . melatonin tablet 5 mg  5 mg Oral QHS Esaw Grandchild A, DO   5 mg at 05/28/20 2206  . ondansetron (ZOFRAN) tablet 4 mg  4 mg Oral Q6H PRN Mansy, Jan A, MD       Or  . ondansetron [facility]) injection 4 mg  4 mg Intravenous Q6H PRN Mansy, Jan A, MD      . pantoprazole (PROTONIX) EC tablet 40 mg  40 mg Oral Daily Mansy, Jan A, MD   40 mg at 05/29/20 0856  .  polyethylene glycol (MIRALAX / GLYCOLAX) packet 17 g  17 g Oral Daily Esaw Grandchild A, DO   17 g at 05/26/20 1540  . senna-docusate (Senokot-S) tablet 1 tablet  1 tablet Oral BID Esaw Grandchild A, DO   1 tablet at 05/29/20 0856  . traZODone (DESYREL) tablet 25 mg  25 mg Oral BID Esaw Grandchild A, DO   25 mg at 05/29/20 1194     Discharge Medications: Please see discharge summary for a list of discharge medications.  Relevant Imaging Results:  Relevant Lab Results:   Additional Information SS# [id number]  Allayne Butcher, RN

## 2020-05-29 NOTE — TOC Initial Note (Signed)
Transition of Care [facility]) - Initial/Assessment Note    Patient Details  Name: Dana Curtis MRN: [id number] Date of Birth: 16-Jun-1921  [facility]) CM/SW Contact:    Allayne Butcher, RN Phone Number: 05/29/2020, 3:36 PM  Clinical Narrative:                 Patient admitted to the hospital with dehydration and acute kidney injury.  RNCM spoke with patient's daughter, Delaney Meigs, via phone.  Patient is from home with her daughters.  Delaney Meigs reports that she assists with all ADL's but patient is able to get up and walk with a walker by herself and get to the restroom at night using her walker by herself.  Patient has all needed equipment at home, walker, wheelchair, ramp, and gait belt.  Patient has had home health in the past.  PT is recommending SNF and Delaney Meigs would really like for the patient to go to SNF, preferably [facility] for short term rehab.  Ricky over at [facility] can accept patient and she can admit tomorrow.  Passr is pending.     Expected Discharge Plan: Skilled Nursing Facility Barriers to Discharge: Continued Medical Work up   Patient Goals and CMS Choice Patient states their goals for this hospitalization and ongoing recovery are:: Family would like for the patient to go to [facility] in [place] for short term rehab CMS Medicare.gov Compare Post Acute Care list provided to:: Patient Represenative (must comment) Choice offered to / list presented to : Adult Children  Expected Discharge Plan and Services Expected Discharge Plan: Skilled Nursing Facility   Discharge Planning Services: CM Consult Post Acute Care Choice: Skilled Nursing Facility Living arrangements for the past 2 months: Single Family Home                 DME Arranged: N/A DME Agency: NA       HH Arranged: NA          Prior Living Arrangements/Services Living arrangements for the past 2 months: Single Family Home Lives with:: Adult Children Patient language and need for interpreter reviewed::  Yes Do you feel safe going back to the place where you live?: Yes      Need for Family Participation in Patient Care: Yes (Comment) (dehydration) Care giver support system in place?: Yes (comment) (daughters) Current home services: DME (walker, wheelchair, ramp, gait belt) Criminal Activity/Legal Involvement Pertinent to Current Situation/Hospitalization: No - Comment as needed  Activities of Daily Living      Permission Sought/Granted Permission sought to share information with : Case Manager,Family Electrical engineer Permission granted to share information with : Yes, Verbal Permission Granted  Share Information with NAME: Delaney Meigs  Permission granted to share info w AGENCY: [facility]  Permission granted to share info w Relationship: daughter     Emotional Assessment Appearance:: Appears stated age     Orientation: : Oriented to Self Alcohol / Substance Use: Not Applicable Psych Involvement: No (comment)  Admission diagnosis:  Dehydration [E86.0] Dehydration, mild [E86.0] AKI (acute kidney injury) (HCC) [N17.9] Lower urinary tract infection, acute [N39.0] Elevated lactic acid level [R79.89] Patient Active Problem List   Diagnosis Date Noted  . Dehydration 05/25/2020  . AKI (acute kidney injury) (HCC) 05/25/2020  . Nausea & vomiting 05/25/2020  . Lactic acidosis 05/25/2020  . Generalized weakness 05/25/2020  . Essential hypertension 05/25/2020  . GERD (gastroesophageal reflux disease) 05/25/2020  . Depression 05/25/2020   PCP:  Lollie Marrow, MD Pharmacy:  No Pharmacies Listed  Social Determinants of Health (SDOH) Interventions    Readmission Risk Interventions No flowsheet data found.

## 2020-05-29 NOTE — Progress Notes (Addendum)
To Whom it may concern: Please be advised that the above named patient will require a short term nursing home stay_ anticipated 30 days or less for rehabilitation and strengthening.  The plan is for return home.   I agree with this notes content  Pleas Koch, MD [facility] 3:44 PM

## 2020-05-29 NOTE — Progress Notes (Signed)
Occupational Therapy Treatment Patient Details Name: Dana Curtis MRN: [id number] DOB: 04-17-1921 Today's Date: 05/29/2020    History of present illness Pt is a 87 y/o F with PMH: HTN, dementia and GERD who lives with dtrs and presented to ED d/t nausea, vomiting and progressive weakness on top of recent tx for UTI (outpatient). Pt adm for AKI 2/2 hypovolemia.   OT comments  Pt seen for OT tx this date to f/u re: safety with ADLs/ADL mobility. Pt presents this date with some increased wakefulness, visual attention and general participation. Pt does however, demo R lateral lean in static sitting requiring pillows for support to sustain static sit w/o external support from OT which varies from her performance with PT yesterday after consulting documentation. Pt participatory in UB bathing and dressing with MOD A and multimodal cues to initiate and sequence task and requires MAX A for STS with RW from elevated EOB surface. Pt demos flexed hips and knees even in standing and requries MAX to TOTAL A for posterior LB bathing and drying. OT assists pt with MAX A squat pivot transfer from bed to chair with arm in arm and use of drop-arm feature on the chair. Pt does clear surfaces ~2-3 inches, but still not coming to fully erect stand. Pt left in chair with head start chair alarm and RN notified of session contents. Pt eating from breakfast tray with SETUP and initiation cues. Pt left with all needs met and in reach. Continue to recommend SNF although pt is progressing with POC.    Follow Up Recommendations  SNF    Equipment Recommendations  Other (comment) (defer)    Recommendations for Other Services      Precautions / Restrictions Precautions Precautions: Fall Restrictions Weight Bearing Restrictions: No       Mobility Bed Mobility Overal bed mobility: Needs Assistance Bed Mobility: Supine to Sit;Sit to Supine     Supine to sit: Max assist;HOB elevated     General bed mobility  comments: increased time, use of rails, max a to manage trunk    Transfers Overall transfer level: Needs assistance Equipment used: 1 person hand held assist;Rolling walker (2 wheeled) Transfers: Public house manager;Sit to/from Stand Sit to Stand: Max assist;From elevated surface   Squat pivot transfers: Max assist     General transfer comment: RW used for STS from EOB, pt's hips still noted to be flexed in standing, almost seemingly in a sqaut. squat-pivot with arm in arm utilized for SPS frmo bed to chair with drop-arm feature used.    Balance Overall balance assessment: Needs assistance Sitting-balance support: Feet supported Sitting balance-Leahy Scale: Poor Sitting balance - Comments: leaning R throughout entire static sit this date despite tactile/verabl cues, 2 pillows used to support R side so that pt could sit w/o external support from therapist. Postural control: Right lateral lean Standing balance support: Bilateral upper extremity supported;During functional activity Standing balance-Leahy Scale: Poor Standing balance comment: B UE support as well as MAX A external support from therapist to sustain static stand. Hips/knees noted to still be slightly in flexion in standing                           ADL either performed or assessed with clinical judgement   ADL Overall ADL's : Needs assistance/impaired     Grooming: Wash/dry hands;Wash/dry face;Applying deodorant;Moderate assistance;Sitting Grooming Details (indicate cue type and reason): R side supported while in EOB sitting as pt with R  lateral lean, pt requires MOD A and consistent multimodal cues as well as increased time to perform UB ADLs. Upper Body Bathing: Moderate assistance;Sitting Upper Body Bathing Details (indicate cue type and reason): requires initiation cues and several sequencing cues throughout Lower Body Bathing: Maximal assistance;Total assistance Lower Body Bathing Details (indicate cue  type and reason): Pt requires MOD/MAX A to come to static standing with RW from elevated EOB And requries TOTAL A to address posterior LB bathing in standing, she is however, able to contribute to anterior LB bathing with MOD A while in sitting Upper Body Dressing : Minimal assistance;Moderate assistance;Sitting;Cueing for sequencing   Lower Body Dressing: Maximal assistance;Total assistance;Sitting/lateral leans Lower Body Dressing Details (indicate cue type and reason): pt does make some effort to contribute, but ultimately requires TOTAL A to don socks in sitting                     Vision Patient Visual Report: No change from baseline Additional Comments: pt with increased visual attention and general attn to task throughout session this date.   Perception     Praxis      Cognition Arousal/Alertness: Awake/alert Behavior During Therapy: Flat affect;WFL for tasks assessed/performed Overall Cognitive Status: No family/caregiver present to determine baseline cognitive functioning                                 General Comments: Patient slow to respond to direction/questions. Requires increased time, multimodal cues to participate.        Exercises Other Exercises Other Exercises: OT engages pt in bathing/dressing tasks as well as chair transfer.   Shoulder Instructions       General Comments      Pertinent Vitals/ Pain       Pain Assessment: No/denies pain  Home Living                                          Prior Functioning/Environment              Frequency  Min 1X/week        Progress Toward Goals  OT Goals(current goals can now be found in the care plan section)  Progress towards OT goals: Progressing toward goals  Acute Rehab OT Goals Patient Stated Goal: to get strong enough to at least be able to transfer with one person assisting so that we can feasibly and safely manage at home. OT Goal Formulation: With  family Time For Goal Achievement: 06/10/20 Potential to Achieve Goals: Good  Plan Discharge plan remains appropriate    Co-evaluation                 AM-PAC OT "6 Clicks" Daily Activity     Outcome Measure   Help from another person eating meals?: A Little Help from another person taking care of personal grooming?: A Lot Help from another person toileting, which includes using toliet, bedpan, or urinal?: A Lot Help from another person bathing (including washing, rinsing, drying)?: A Lot Help from another person to put on and taking off regular upper body clothing?: A Lot Help from another person to put on and taking off regular lower body clothing?: A Lot 6 Click Score: 13    End of Session Equipment Utilized During Treatment: Gait belt;Rolling walker  OT Visit  Diagnosis: Unsteadiness on feet (R26.81);Muscle weakness (generalized) (M62.81)   Activity Tolerance Patient tolerated treatment well   Patient Left with call bell/phone within reach;in chair;with chair alarm set (headstart chair alarm)   Nurse Communication Mobility status        Time: [phone] OT Time Calculation (min): 45 min  Charges: OT General Charges $OT Visit: 1 Visit OT Treatments $Self Care/Home Management : 23-37 mins $Therapeutic Activity: 8-22 mins  Gerrianne Scale, [place], OTR/L ascom [phone] 05/29/20, 5:36 PM

## 2020-05-29 NOTE — Progress Notes (Signed)
PROGRESS NOTE   Dana Curtis  ZES:[id number] DOB: 02-06-1922 DOA: 05/24/2020 PCP: Lollie Marrow, MD  Brief Narrative:  87 y.o. female hypertension, dementia and GERD who lives at home with daughters, who presented to the ED on 05/25/20 around midnight with recurrent nausea, vomiting over the last 5 to 6 days, in addition to recent UTI with progressive generalized weakness to the point pt was unable to even stand up. Treated for UTI as outpatient, initially with Keflex until cultures return showing resistance.  Changed to Bactrim, however continue to have worsening weakness and nausea with abdominal pain.    Evaluation in the ED showed signs of dehydration with AKI, lactic acidosis, UA with pyuria and high specific gravity.  Pt admitted and started on IV hydration in addition to Rocephin for her UTI.  Hospital-Problem based course  Volume depletion secondary to nausea vomiting from resistant E. coli UTI AKI is resolved-patient is currently saline lock Not really eating or drinking much Daughter understands that patient may not return to her usual baseline and we will discharge patient [facility]15 with palliative following Resistant E. coli UTI Blood culture negative X 3 days, urine culture no growth from 4/9 Received IV Rocephin until 4/11 Metabolic encephalopathy superimposed on underlying dementia Trazodone twice daily, delirium precautions Frequent reorientation Therapy recommending skilled on discharge Continue melatonin that was ordered Hypertension Continue amlodipine alone-discontinue losartan given age beers criteria medication and risk for AKI Adult failure to thrive  Will discuss with family implications    DVT prophylaxis: Lovenox Code Status: DNR Family Communication: Called and updated daughter Delaney Meigs [phone] on 4/13 Disposition:  Status is: Inpatient  Remains inpatient appropriate because:Hemodynamically unstable   Dispo: The patient is from: Home               Anticipated d/c is to: SNF              Patient currently is medically stable to d/c.   Difficult to place patient No   Consultants:   None at this time  Procedures: No  Antimicrobials: Rocephin ending 4/11   Subjective: Confused-drinking only not really eating much Worked with OT today Plan is for skilled facility placement 4/15-Covid test ordered Objective: Vitals:   05/29/20 0447 05/29/20 0804 05/29/20 0833 05/29/20 1220  BP: (!) 149/79 (!) 179/88 (!) 166/84 (!) 143/78  Pulse: (!) 109 (!) 115 (!) 105 (!) 109  Resp: 16 17 17 17   Temp: 99.2 F (37.3 C) 97.8 F (36.6 C) 97.8 F (36.6 C) 98 F (36.7 C)  TempSrc: Oral     SpO2: 95% 97% 97% 100%  Weight:      Height:        Intake/Output Summary (Last 24 hours) at 05/29/2020 1603 Last data filed at 05/29/2020 1428 Gross per 24 hour  Intake 600 ml  Output --  Net 600 ml   Filed Weights   05/24/20 2232  Weight: 65.8 kg    Examination:  Frail white female  Quite sleepy S1-S2 no murmur-not on telemetry Abdomen soft no rebound no guarding No lower extremity edema Sacrum not examined Neurological exam deferred   Data Reviewed: personally reviewed   No labs today  Radiology Studies: No results found.   Scheduled Meds: . amLODipine  5 mg Oral Daily  . calcium-vitamin D  1 tablet Oral QHS  . diclofenac Sodium  2 g Topical BID  . DULoxetine  30 mg Oral Daily  . enoxaparin (LOVENOX) injection  40 mg Subcutaneous Q24H  .  escitalopram  5 mg Oral Daily  . estradiol  1 Applicatorful Vaginal QHS  . melatonin  5 mg Oral QHS  . pantoprazole  40 mg Oral Daily  . polyethylene glycol  17 g Oral Daily  . senna-docusate  1 tablet Oral BID  . traZODone  25 mg Oral BID   Continuous Infusions:   LOS: 4 days   Time spent: 27 minutes  Rhetta Mura, MD [facility] To contact the attending provider between 7A-7P or the covering provider during after hours 7P-7A, please log into the web site  [email or web address] and access using universal [place] password for that web site. If you do not have the password, please call the hospital operator.  05/29/2020, 4:03 PM

## 2020-05-30 LAB — CULTURE, BLOOD (SINGLE): Culture: NO GROWTH

## 2020-05-30 LAB — SARS CORONAVIRUS 2 (TAT 6-24 HRS): SARS Coronavirus 2: NEGATIVE

## 2020-05-30 MED ORDER — DULOXETINE HCL 30 MG PO CPEP: 30.0000 mg | ORAL_CAPSULE | Freq: Every day | ORAL | 0 refills | Status: AC

## 2020-05-30 MED ORDER — MELATONIN 5 MG PO TABS: 5.0000 mg | ORAL_TABLET | Freq: Every day | ORAL | 0 refills | Status: AC

## 2020-05-30 MED ORDER — ESCITALOPRAM OXALATE 5 MG PO TABS: 5.0000 mg | ORAL_TABLET | Freq: Every day | ORAL | 0 refills | Status: AC

## 2020-05-30 MED ORDER — POLYETHYLENE GLYCOL 3350 17 G PO PACK: 17.0000 g | PACK | Freq: Every day | ORAL | 0 refills | Status: AC

## 2020-05-30 MED ORDER — TRAZODONE HCL 50 MG PO TABS: 50.0000 mg | ORAL_TABLET | Freq: Every day | ORAL | 0 refills | Status: AC

## 2020-05-30 NOTE — Care Management Important Message (Signed)
Important Message  Patient Details  Name: Dana Curtis MRN: [id number] Date of Birth: 10/02/1921   Medicare Important Message Given:  Yes  I reviewed the Important Message from Medicare with daughter, Chrys Racer [phone]) and she is in agreement with the discharge plan.  I asked if she would like a copy of the form and she replied, yes. Asked that I send via Secure e-mail to [email or web address].    Olegario Messier A Dalin Caldera 05/30/2020, 9:40 AM

## 2020-05-30 NOTE — Plan of Care (Signed)
End of Shift Summary:  Alert to self. VSS. Remained on room air, sats >94%. No c/o pain or n/v. Repositioned in bed as pt would allow. Urine output adequate, incontinent. (-) BM. Frequent contacts made in lieu of call light use. Pt remained free from falls or injury. Bed low and in locked position with bed alarm on. Fall mat in place.   Problem: Health Behavior/Discharge Planning: Goal: Ability to manage health-related needs will improve Outcome: Progressing   Problem: Clinical Measurements: Goal: Ability to maintain clinical measurements within normal limits will improve Outcome: Progressing Goal: Will remain free from infection Outcome: Progressing Goal: Diagnostic test results will improve Outcome: Progressing Goal: Respiratory complications will improve Outcome: Progressing Goal: Cardiovascular complication will be avoided Outcome: Progressing   Problem: Activity: Goal: Risk for activity intolerance will decrease Outcome: Progressing   Problem: Pain Managment: Goal: General experience of comfort will improve Outcome: Progressing   Problem: Safety: Goal: Ability to remain free from injury will improve Outcome: Progressing   Problem: Skin Integrity: Goal: Risk for impaired skin integrity will decrease Outcome: Progressing

## 2020-05-30 NOTE — TOC Progression Note (Signed)
Transition of Care [facility]) - Progression Note    Patient Details  Name: Dana Curtis MRN: [id number] Date of Birth: [birth year]  Transition of Care [facility]) CM/SW Contact  Allayne Butcher, RN Phone Number: 05/30/2020, 9:08 AM  Clinical Narrative:     Passr [phone] E.  Start date 05/29/20- expires 06/28/20  Expected Discharge Plan: Skilled Nursing Facility Barriers to Discharge: Continued Medical Work up  Expected Discharge Plan and Services Expected Discharge Plan: Skilled Nursing Facility   Discharge Planning Services: CM Consult Post Acute Care Choice: Skilled Nursing Facility Living arrangements for the past 2 months: Single Family Home Expected Discharge Date: 05/30/20               DME Arranged: N/A DME Agency: NA       HH Arranged: NA           Social Determinants of Health (SDOH) Interventions    Readmission Risk Interventions No flowsheet data found.

## 2020-05-30 NOTE — TOC Progression Note (Signed)
Transition of Care [facility]) - Progression Note    Patient Details  Name: Dana Curtis MRN: [id number] Date of Birth: Apr 06, 1921  Transition [facility] Care [facility]) CM/SW Contact  Allayne Butcher, RN Phone Number: 05/30/2020, 11:11 AM  Clinical Narrative:    EMS has been arranged.  Bedside RN reports patient will be ready for pickup by 1230.    Expected Discharge Plan: Skilled Nursing Facility Barriers to Discharge: Barriers Resolved  Expected Discharge Plan and Services Expected Discharge Plan: Skilled Nursing Facility   Discharge Planning Services: CM Consult Post Acute Care Choice: Skilled Nursing Facility Living arrangements for the past 2 months: Single Family Home Expected Discharge Date: 05/30/20               DME Arranged: N/A DME Agency: NA       HH Arranged: NA           Social Determinants of Health (SDOH) Interventions    Readmission Risk Interventions No flowsheet data found.

## 2020-05-30 NOTE — TOC Transition Note (Signed)
Transition of Care [facility]) - CM/SW Discharge Note   Patient Details  Name: [name] MRN: [id number] Date of Birth: [birth year]  Transition of Care [facility]) CM/SW Contact:  Allayne Butcher, RN Phone Number: 05/30/2020, 10:55 AM   Clinical Narrative:    Patient is medically cleared for discharge to [facility] today.  All discharge information faxed over to [place] at [facility].  Patient will be going to room E 8.  Delaney Meigs, daughter, updated on discharge for today.  Bedside RN will call report to [phone].  RNCM will arrange EMS transport.    Final next level of care: Skilled Nursing Facility Barriers to Discharge: Barriers Resolved   Patient Goals and CMS Choice Patient states their goals for this hospitalization and ongoing recovery are:: Family would like for the patient to go to [facility] in [place] for short term rehab CMS Medicare.gov Compare Post Acute Care list provided to:: Patient Represenative (must comment) Choice offered to / list presented to : Adult Children  Discharge Placement PASRR number recieved: 05/30/20            Patient chooses bed at: [facility] Patient to be transferred to facility by: [facility] Name of family member notified: Delaney Meigs Patient and family notified of of transfer: 05/30/20  Discharge Plan and Services   Discharge Planning Services: CM Consult Post Acute Care Choice: Skilled Nursing Facility          DME Arranged: N/A DME Agency: NA       HH Arranged: NA          Social Determinants of Health (SDOH) Interventions     Readmission Risk Interventions No flowsheet data found.

## 2020-05-30 NOTE — Discharge Summary (Signed)
Physician Discharge Summary  Fritzi MandesJoyce Lippe ZOX:[id number] DOB: 1921/04/06 DOA: 05/24/2020  PCP: Lollie MarrowBlomberg, Ben A, MD  Admit date: 05/24/2020 Discharge date: 05/30/2020  Time spent: 27 minutes  Recommendations for Outpatient Follow-up:  1. Needs op eval at SNF With palliativer care following 2. rec cbc/cmet 1 week 3. Minimize in OP setting non-essential meds   Discharge Diagnoses:  MAIN problem for hospitalization   E coli UTI Severe dehydration on admit  Please see below for itemized issues addressed in HOpsital- refer to other progress notes for clarity if needed  Discharge Condition: gaurded  Diet recommendation: soft, Dys 3/Minced/Nectar  Filed Weights   05/24/20 2232  Weight: 65.8 kg    History of present illness:  87 y.o.femalehypertension, dementia and GERD who lives at home with daughters, who presented to the ED on 05/25/20 around midnight with recurrent nausea, vomiting over the last 5 to 6 days, in addition to recent UTI with progressive generalized weakness to the point pt was unable to even stand up. Treated for UTI as outpatient, initially with Keflex until cultures return showing resistance.  Changed to Bactrim, however continue to have worsening weakness and nausea with abdominal pain.    Evaluation in the ED showed signs of dehydration with AKI, lactic acidosis, UA with pyuria and high specific gravity.  Pt admitted and started on IV hydration in addition to Rocephin for her UTI.  Hospital Course:   Volume depletion secondary to nausea vomiting from resistant E. coli UTI AKI is resolved-patient is currently saline lock Not really eating or drinking much Daughter understands that patient may not return to her usual baseline and we will discharge patient to SNF 4/15 with palliative following Resistant E. coli UTI Blood culture negative X 3 days, urine culture no growth from 4/9 Received IV Rocephin until 4/11 Metabolic encephalopathy superimposed on underlying  dementia Trazodone twice daily, delirium precautions Frequent reorientation Therapy recommending skilled on discharge Continue melatonin that was ordered Hypertension Continue amlodipine alone-discontinue losartan given age beers criteria medication and risk for AKI Adult failure to thrive   have discussed on numerous days implications of failure to thrive and overall frailty with the patient's family I am hopeful that we can prevent delirium and further decline in mental state   Discharge Exam: Vitals:   05/30/20 0742 05/30/20 0753  BP:  (!) 112/53  Pulse:  91  Resp:  20  Temp:  98.3 F (36.8 C)  SpO2: 96% 95%    Subj on day of d/c   Awake alert but confused  General Exam on discharge  EOMI NCAT quite frail supra clavicular and bitemporal wasting Chest clear no added sound Abdomen soft No lower extremity edema ROM intact  Discharge Instructions   Discharge Instructions    Diet - low sodium heart healthy   Complete by: As directed    Increase activity slowly   Complete by: As directed      Allergies as of 05/30/2020      Reactions   Lisinopril    Macrobid [nitrofurantoin]       Medication List    STOP taking these medications   Calcium Carbonate-Vitamin D 600-400 MG-UNIT tablet   clotrimazole 1 % cream Commonly known as: LOTRIMIN   Cranberry 450 MG Tabs   losartan 100 MG tablet Commonly known as: COZAAR   ondansetron 4 MG disintegrating tablet Commonly known as: ZOFRAN-ODT   sulfamethoxazole-trimethoprim 800-160 MG tablet Commonly known as: BACTRIM DS     TAKE these medications   acetaminophen  500 MG tablet Commonly known as: TYLENOL Take 1,000 mg by mouth in the morning and at bedtime.   amLODipine 5 MG tablet Commonly known as: NORVASC Take 5 mg by mouth at bedtime.   BIOFREEZE EX Apply topically 3 (three) times daily as needed (pain). Apply to feet, shoulders, or other muscles and joints   diclofenac Sodium 1 % Gel Commonly known  as: VOLTAREN Apply 2 g topically in the morning and at bedtime. To right shoulder   DULoxetine 30 MG capsule Commonly known as: CYMBALTA Take 1 capsule (30 mg total) by mouth daily.   escitalopram 5 MG tablet Commonly known as: LEXAPRO Take 1 tablet (5 mg total) by mouth daily.   esomeprazole 40 MG capsule Commonly known as: NEXIUM Take 40 mg by mouth daily at 12 noon.   estradiol 0.1 MG/GM vaginal cream Commonly known as: ESTRACE Place 1 Applicatorful vaginally at bedtime.   melatonin 5 MG Tabs Take 1 tablet (5 mg total) by mouth at bedtime. What changed:   medication strength  how much to take   polyethylene glycol 17 g packet Commonly known as: MIRALAX / GLYCOLAX Take 17 g by mouth daily.   selenium sulfide 1 % Lotn Commonly known as: SELSUN Apply 1 application topically daily.   senna 8.6 MG Tabs tablet Commonly known as: SENOKOT Take 2 tablets by mouth daily as needed for mild constipation.   traZODone 50 MG tablet Commonly known as: DESYREL Take 1 tablet (50 mg total) by mouth at bedtime.      Allergies  Allergen Reactions  . Lisinopril   . Macrobid [Nitrofurantoin]     Contact information for after-discharge care    Destination    [facility] .   Service: Skilled Nursing Contact information: [address] [place] [address] [phone]                   The results of significant diagnostics from this hospitalization (including imaging, microbiology, ancillary and laboratory) are listed below for reference.    Significant Diagnostic Studies: CT Head Wo Contrast  Result Date: 05/25/2020 CLINICAL DATA:  Encephalopathy EXAM: CT HEAD WITHOUT CONTRAST TECHNIQUE: Contiguous axial images were obtained from the base of the skull through the vertex without intravenous contrast. COMPARISON:  03/02/2018 FINDINGS: Brain: There is no mass, hemorrhage or extra-axial collection. There is generalized atrophy  without lobar predilection. Hypodensity of the white matter is most commonly associated with chronic microvascular disease. Vascular: Atherosclerotic calcification of the vertebral and internal carotid arteries at the skull base. No abnormal hyperdensity of the major intracranial arteries or dural venous sinuses. Skull: The visualized skull base, calvarium and extracranial soft tissues are normal. Sinuses/Orbits: No fluid levels or advanced mucosal thickening of the visualized paranasal sinuses. No mastoid or middle ear effusion. The orbits are normal. IMPRESSION: Generalized atrophy and chronic microvascular ischemia without acute intracranial abnormality. Electronically Signed   By: Deatra Robinson M.D.   On: 05/25/2020 02:08   CT ABDOMEN PELVIS W CONTRAST  Result Date: 05/25/2020 CLINICAL DATA:  Altered mental status. EXAM: CT ABDOMEN AND PELVIS WITH CONTRAST TECHNIQUE: Multidetector CT imaging of the abdomen and pelvis was performed using the standard protocol following bolus administration of intravenous contrast. CONTRAST:  9mL OMNIPAQUE IOHEXOL 300 MG/ML  SOLN COMPARISON:  May 10, 2019 and July 28, 2016 FINDINGS: Lower chest: A stable, benign 7 mm noncalcified lung nodule is seen within the left lung base. Hepatobiliary: A stable 3.3 cm x  3.3 cm low-attenuation liver lesion is seen within the left lobe. No gallstones, gallbladder wall thickening, or biliary dilatation. Pancreas: Unremarkable. No pancreatic ductal dilatation or surrounding inflammatory changes. Spleen: Normal in size without focal abnormality. Adrenals/Urinary Tract: Adrenal glands are unremarkable. Kidneys are normal, without renal calculi or hydronephrosis. A small, stable renal cysts are seen within the left kidney. Bladder is unremarkable. Stomach/Bowel: There is a large gastric hernia. Appendix appears normal. No evidence of bowel wall thickening, distention, or inflammatory changes. Noninflamed diverticula are seen throughout the  sigmoid colon. Additional small bowel diverticula are seen within the mid to upper left abdomen (axial CT images 28 through 35, CT series number 2). These are increased in size when compared to the prior exam. Vascular/Lymphatic: Aortic atherosclerosis. No enlarged abdominal or pelvic lymph nodes. Reproductive: Uterus and bilateral adnexa are unremarkable. Other: No abdominal wall hernia or abnormality. No abdominopelvic ascites. Musculoskeletal: Multilevel degenerative changes seen throughout the lumbar spine. IMPRESSION: 1. Large, stable gastric hernia. 2. Sigmoid and small bowel diverticulosis. 3. Stable hepatic cyst. 4. Aortic atherosclerosis. Aortic Atherosclerosis (ICD10-I70.0). Electronically Signed   By: Aram Candela M.D.   On: 05/25/2020 02:32   DG Chest Portable 1 View  Result Date: 05/25/2020 CLINICAL DATA:  Weakness EXAM: PORTABLE CHEST 1 VIEW COMPARISON:  None. FINDINGS: Mild cardiomegaly. Bibasilar atelectasis. No pneumothorax or sizable pleural effusion. IMPRESSION: Mild cardiomegaly and bibasilar atelectasis. Electronically Signed   By: Deatra Robinson M.D.   On: 05/25/2020 00:09    Microbiology: Recent Results (from the past 240 hour(s))  Blood culture (single)     Status: None   Collection Time: 05/24/20 11:49 PM   Specimen: BLOOD  Result Value Ref Range Status   Specimen Description BLOOD RIGHT [facility]  Final   Special Requests   Final    BOTTLES DRAWN AEROBIC AND ANAEROBIC Blood Culture results may not be optimal due to an inadequate volume of blood received in culture bottles   Culture   Final    NO GROWTH 5 DAYS Performed at [facility], [address], [place], [place] [address]    Report Status 05/30/2020 FINAL  Final  SARS CORONAVIRUS 2 (TAT 6-24 HRS) Nasopharyngeal Nasopharyngeal Swab     Status: None   Collection Time: 05/25/20 12:22 AM   Specimen: Nasopharyngeal Swab  Result Value Ref Range Status   SARS Coronavirus 2 NEGATIVE NEGATIVE Final    Comment:  (NOTE) SARS-CoV-2 target nucleic acids are NOT DETECTED.  The SARS-CoV-2 RNA is generally detectable in upper and lower respiratory specimens during the acute phase of infection. Negative results do not preclude SARS-CoV-2 infection, do not rule out co-infections with other pathogens, and should not be used as the sole basis for treatment or other patient management decisions. Negative results must be combined with clinical observations, patient history, and epidemiological information. The expected result is Negative.  Fact Sheet for Patients: HairSlick.no  Fact Sheet for Healthcare Providers: quierodirigir.com  This test is not yet approved or cleared by the [place] FDA and  has been authorized for detection and/or diagnosis of SARS-CoV-2 by FDA under an Emergency Use Authorization (EUA). This EUA will remain  in effect (meaning this test can be used) for the duration of the COVID-19 declaration under Se ction 564(b)(1) of the Act, 21 U.S.C. section 360bbb-3(b)(1), unless the authorization is terminated or revoked sooner.  Performed at [facility], [address] [place], [place] [address]   Urine Culture     Status: None  Collection Time: 05/25/20  1:49 AM   Specimen: Urine, Random  Result Value Ref Range Status   Specimen Description   Final    URINE, RANDOM Performed at [facility], [address], [place], [place] [address]    Special Requests   Final    NONE Performed at [facility], [address] [place], [place] [address]    Culture   Final    NO GROWTH Performed at [facility], [address], [place], [place] [address]    Report Status 05/26/2020 FINAL  Final  SARS CORONAVIRUS 2 (TAT 6-24 HRS) Nasopharyngeal Nasopharyngeal Swab     Status: None   Collection Time: 05/29/20  5:00 PM   Specimen: Nasopharyngeal Swab  Result Value Ref Range Status   SARS  Coronavirus 2 NEGATIVE NEGATIVE Final    Comment: (NOTE) SARS-CoV-2 target nucleic acids are NOT DETECTED.  The SARS-CoV-2 RNA is generally detectable in upper and lower respiratory specimens during the acute phase of infection. Negative results do not preclude SARS-CoV-2 infection, do not rule out co-infections with other pathogens, and should not be used as the sole basis for treatment or other patient management decisions. Negative results must be combined with clinical observations, patient history, and epidemiological information. The expected result is Negative.  Fact Sheet for Patients: HairSlick.no  Fact Sheet for Healthcare Providers: quierodirigir.com  This test is not yet approved or cleared by the [place] FDA and  has been authorized for detection and/or diagnosis of SARS-CoV-2 by FDA under an Emergency Use Authorization (EUA). This EUA will remain  in effect (meaning this test can be used) for the duration of the COVID-19 declaration under Se ction 564(b)(1) of the Act, 21 U.S.C. section 360bbb-3(b)(1), unless the authorization is terminated or revoked sooner.  Performed at [facility], [address], [place], [place] [address]      Labs: Basic Metabolic Panel: Recent Labs  Lab 05/24/20 2314 05/25/20 0508 05/26/20 0427 05/27/20 0625  NA 137 140 139 140  K 3.9 3.7 3.6 4.2  CL 103 108 108 108  CO2 22 25 22 23   GLUCOSE 167* 100* 118* 100*  BUN 21 17 9 10   CREATININE 1.22* 1.03* 0.77 0.79  CALCIUM 9.6 9.0 8.7* 8.6*  MG  --   --  2.1  --    Liver Function Tests: Recent Labs  Lab 05/24/20 2314  AST 27  ALT 26  ALKPHOS 56  BILITOT 0.4  PROT 6.9  ALBUMIN 4.0   Recent Labs  Lab 05/24/20 2314  LIPASE 28   No results for input(s): AMMONIA in the last 168 hours. CBC: Recent Labs  Lab 05/24/20 2314 05/25/20 0508 05/26/20 0427 05/27/20 0625 05/28/20 0518  WBC 11.6* 10.7* 11.8*  11.7* 10.8*  HGB 13.6 13.0 13.0 13.1 13.5  HCT 41.7 40.3 39.6 39.8 41.3  MCV 97.9 97.1 95.0 96.4 96.0  PLT 310 294 301 289 280   Cardiac Enzymes: No results for input(s): CKTOTAL, CKMB, CKMBINDEX, TROPONINI in the last 168 hours. BNP: BNP (last 3 results) No results for input(s): BNP in the last 8760 hours.  ProBNP (last 3 results) No results for input(s): PROBNP in the last 8760 hours.  CBG: No results for input(s): GLUCAP in the last 168 hours.     Signed:  07/27/20 MD   [facility] 05/30/2020, 8:23 AM

## 2020-05-30 NOTE — Progress Notes (Signed)
Report provided to Aaron Edelman, RN at [facility]. All questions addressed at time of call

## 2021-01-15 DEATH — deceased
